# Patient Record
Sex: Female | Born: 1975 | Race: Black or African American | Hispanic: No | Smoking: Never smoker
Health system: Southern US, Community
[De-identification: ages and names within clinical notes are randomized; demographics above are authoritative.]

## PROBLEM LIST (undated history)

## (undated) DIAGNOSIS — B019 Varicella without complication: Secondary | ICD-10-CM

## (undated) DIAGNOSIS — I10 Essential (primary) hypertension: Secondary | ICD-10-CM

## (undated) DIAGNOSIS — G43909 Migraine, unspecified, not intractable, without status migrainosus: Secondary | ICD-10-CM

## (undated) HISTORY — DX: Varicella without complication: B01.9

## (undated) HISTORY — DX: Migraine, unspecified, not intractable, without status migrainosus: G43.909

---

## 1999-05-30 ENCOUNTER — Other Ambulatory Visit: Admission: RE | Admit: 1999-05-30 | Discharge: 1999-05-30 | Payer: Self-pay | Admitting: Obstetrics and Gynecology

## 2002-09-17 ENCOUNTER — Other Ambulatory Visit: Admission: RE | Admit: 2002-09-17 | Discharge: 2002-09-17 | Payer: Self-pay | Admitting: Obstetrics and Gynecology

## 2002-09-22 ENCOUNTER — Ambulatory Visit (HOSPITAL_COMMUNITY): Admission: RE | Admit: 2002-09-22 | Discharge: 2002-09-22 | Payer: Self-pay | Admitting: Obstetrics and Gynecology

## 2002-09-22 ENCOUNTER — Encounter: Payer: Self-pay | Admitting: Obstetrics and Gynecology

## 2003-02-18 ENCOUNTER — Inpatient Hospital Stay (HOSPITAL_COMMUNITY): Admission: AD | Admit: 2003-02-18 | Discharge: 2003-02-20 | Payer: Self-pay | Admitting: Obstetrics and Gynecology

## 2008-12-13 ENCOUNTER — Emergency Department (HOSPITAL_BASED_OUTPATIENT_CLINIC_OR_DEPARTMENT_OTHER): Admission: EM | Admit: 2008-12-13 | Discharge: 2008-12-13 | Payer: Self-pay | Admitting: Emergency Medicine

## 2008-12-13 ENCOUNTER — Ambulatory Visit: Payer: Self-pay | Admitting: Diagnostic Radiology

## 2009-01-10 ENCOUNTER — Emergency Department (HOSPITAL_BASED_OUTPATIENT_CLINIC_OR_DEPARTMENT_OTHER): Admission: EM | Admit: 2009-01-10 | Discharge: 2009-01-11 | Payer: Self-pay | Admitting: Emergency Medicine

## 2009-01-10 ENCOUNTER — Ambulatory Visit: Payer: Self-pay | Admitting: Diagnostic Radiology

## 2010-09-14 NOTE — Discharge Summary (Signed)
NAME:  Tina Swanson, Tina Swanson                    ACCOUNT NO.:  [id number]   MEDICAL RECORD NO.:  [id number]                   PATIENT TYPE:  INP   LOCATION:  9121                                 FACILITY:  [facility]   PHYSICIAN:  Huel Cote, M.D.              DATE OF BIRTH:  1975/10/01   DATE OF ADMISSION:  02/18/2003  DATE OF DISCHARGE:  02/20/2003                                 DISCHARGE SUMMARY   DISCHARGE DIAGNOSES:  1. Term pregnancy at [redacted] weeks gestation.  2. Status post normal spontaneous vaginal delivery.   DISCHARGE MEDICATIONS:  1. Motrin 600 mg p.o. every 6 hours.  2. Percocet one to two tablets p.o. every 4 hours p.r.n.   HOSPITAL FOLLOW UP:  The patient is to follow up in the office in six weeks  for her routine postpartum exam.   HOSPITAL COURSE:  The patient is a 35 year old G 5, P 1-0-3-1 who is  admitted on February 18, 2003 in active labor.  Prenatal care was essentially  uncomplicated and on admission the patient was noted to be contracting  regularly and 5 cm dilated.  She did have some size less than dates issue at  term.  However, an ultrasound revealed normal growth in the 29th percentile  at 33 weeks.   PRENATAL LABORATORY DATA:  O positive, antibody negative, sickle negative,  RPR nonreactive, rubella immune, hepatitis B surface antigen negative, HIV  negative, GC negative, Chlamydia negative, triple screen normal, group B  strep positive.  One-hour Glucola 111.   PAST MEDICAL HISTORY:  None.   PAST GYNECOLOGICAL HISTORY:  History of HSV.   PAST OBSTETRICAL HISTORY:  In 1995 she had a spontaneous miscarriage.  In  1998 she had a 5 pound 15 ounce infant vaginally.  In 2002 she had elective  abortions.   PAST SURGICAL HISTORY:  None.   On admission, the patient was afebrile with stable vital signs.  Heart exam  is regular.  Lungs are clear.  Blood pressure is 138/82.  She was placed on  penicillin for her positive group B strep status and her  progress was  observed.  She did have rupture of membranes performed at 6 cm and was  placed on Pitocin for augmentation of labor.  This was stopped when she  began to have recurrent variable decelerations and with repositioning the  variables resolved.  The patient reached complete dilation quickly, pushed  well and had a normal spontaneous vaginal delivery of a viable female infant.  Apgars are 9 and 9.  Weight was 6 pounds 13 ounces.  The baby was delivered  through a loose nuchal cord x1.  Placenta was spontaneously delivered  intact.  Perineum had a small  abrasion but did not require suturing. On postpartum day #2 she was doing  very well.  She had no complaints, she was afebrile with stable vital signs.  Her fundus was firm and therefore she was discharged  to home with Motrin and  Percocet and follow up as previously stated.                                               Huel Cote, M.D.    KR/MEDQ  D:  02/20/2003  T:  02/20/2003  Job:  [id number]

## 2013-04-16 ENCOUNTER — Encounter (HOSPITAL_BASED_OUTPATIENT_CLINIC_OR_DEPARTMENT_OTHER): Payer: Self-pay | Admitting: Emergency Medicine

## 2013-04-16 ENCOUNTER — Emergency Department (HOSPITAL_BASED_OUTPATIENT_CLINIC_OR_DEPARTMENT_OTHER)
Admission: EM | Admit: 2013-04-16 | Discharge: 2013-04-17 | Disposition: A | Discharge status: Home / Self Care | Payer: Managed Care, Other (non HMO) | Attending: Emergency Medicine | Admitting: Emergency Medicine

## 2013-04-16 DIAGNOSIS — S139XXA Sprain of joints and ligaments of unspecified parts of neck, initial encounter: Secondary | ICD-10-CM | POA: Insufficient documentation

## 2013-04-16 DIAGNOSIS — S60229A Contusion of unspecified hand, initial encounter: Secondary | ICD-10-CM | POA: Insufficient documentation

## 2013-04-16 DIAGNOSIS — Y92410 Unspecified street and highway as the place of occurrence of the external cause: Secondary | ICD-10-CM | POA: Insufficient documentation

## 2013-04-16 DIAGNOSIS — S161XXA Strain of muscle, fascia and tendon at neck level, initial encounter: Secondary | ICD-10-CM

## 2013-04-16 DIAGNOSIS — Y9389 Activity, other specified: Secondary | ICD-10-CM | POA: Insufficient documentation

## 2013-04-16 DIAGNOSIS — IMO0002 Reserved for concepts with insufficient information to code with codable children: Secondary | ICD-10-CM | POA: Insufficient documentation

## 2013-04-16 DIAGNOSIS — T148XXA Other injury of unspecified body region, initial encounter: Secondary | ICD-10-CM
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
## 2013-04-17 ENCOUNTER — Emergency Department (HOSPITAL_BASED_OUTPATIENT_CLINIC_OR_DEPARTMENT_OTHER): Payer: Managed Care, Other (non HMO)

## 2013-04-17 MED ORDER — IBUPROFEN 600 MG PO TABS: 600.0000 mg | ORAL_TABLET | Freq: Four times a day (QID) | ORAL | Status: DC | PRN

## 2013-04-17 MED ORDER — METHOCARBAMOL 500 MG PO TABS: 500.0000 mg | ORAL_TABLET | Freq: Two times a day (BID) | ORAL | Status: DC

## 2013-04-17 MED ORDER — IBUPROFEN 400 MG PO TABS: 400.0000 mg | ORAL_TABLET | Freq: Once | ORAL | Status: DC

## 2013-04-17 MED ORDER — HYDROCODONE-ACETAMINOPHEN 5-325 MG PO TABS: 1.0000 | ORAL_TABLET | Freq: Four times a day (QID) | ORAL | Status: DC | PRN

## 2013-04-17 MED ORDER — HYDROCODONE-ACETAMINOPHEN 5-325 MG PO TABS
1.0000 | ORAL_TABLET | Freq: Once | ORAL | Status: AC
  Administered 2013-04-17: 1 via ORAL
  Filled 2013-04-17: qty 1

## 2016-05-30 ENCOUNTER — Ambulatory Visit (INDEPENDENT_AMBULATORY_CARE_PROVIDER_SITE_OTHER): Payer: Managed Care, Other (non HMO) | Admitting: Nurse Practitioner

## 2016-05-30 ENCOUNTER — Encounter: Payer: Self-pay | Admitting: Nurse Practitioner

## 2016-05-30 ENCOUNTER — Other Ambulatory Visit (INDEPENDENT_AMBULATORY_CARE_PROVIDER_SITE_OTHER): Payer: Managed Care, Other (non HMO)

## 2016-05-30 VITALS — BP 138/94 | HR 77 | Temp 98.0°F | Ht 65.0 in | Wt 173.0 lb

## 2016-05-30 DIAGNOSIS — M545 Low back pain, unspecified: Secondary | ICD-10-CM

## 2016-05-30 DIAGNOSIS — Z0001 Encounter for general adult medical examination with abnormal findings: Secondary | ICD-10-CM | POA: Diagnosis not present

## 2016-05-30 DIAGNOSIS — Z23 Encounter for immunization: Secondary | ICD-10-CM

## 2016-05-30 LAB — CBC WITH DIFFERENTIAL/PLATELET
BASOS ABS: 0 10*3/uL (ref 0.0–0.1)
Basophils Relative: 0.7 % (ref 0.0–3.0)
Eosinophils Absolute: 0 10*3/uL (ref 0.0–0.7)
Eosinophils Relative: 0.6 % (ref 0.0–5.0)
HEMATOCRIT: 36.8 % (ref 36.0–46.0)
Hemoglobin: 12.2 g/dL (ref 12.0–15.0)
LYMPHS ABS: 1.7 10*3/uL (ref 0.7–4.0)
LYMPHS PCT: 37.4 % (ref 12.0–46.0)
MCHC: 33.2 g/dL (ref 30.0–36.0)
MCV: 83.6 fl (ref 78.0–100.0)
MONOS PCT: 5.6 % (ref 3.0–12.0)
Monocytes Absolute: 0.3 10*3/uL (ref 0.1–1.0)
NEUTROS PCT: 55.7 % (ref 43.0–77.0)
Neutro Abs: 2.5 10*3/uL (ref 1.4–7.7)
PLATELETS: 295 10*3/uL (ref 150.0–400.0)
RBC: 4.4 Mil/uL (ref 3.87–5.11)
RDW: 12.8 % (ref 11.5–15.5)
WBC: 4.4 10*3/uL (ref 4.0–10.5)

## 2016-05-30 LAB — COMPREHENSIVE METABOLIC PANEL
ALT: 12 U/L (ref 0–35)
AST: 14 U/L (ref 0–37)
Albumin: 4.1 g/dL (ref 3.5–5.2)
Alkaline Phosphatase: 57 U/L (ref 39–117)
BILIRUBIN TOTAL: 0.3 mg/dL (ref 0.2–1.2)
BUN: 10 mg/dL (ref 6–23)
CO2: 29 meq/L (ref 19–32)
CREATININE: 0.77 mg/dL (ref 0.40–1.20)
Calcium: 9 mg/dL (ref 8.4–10.5)
Chloride: 107 mEq/L (ref 96–112)
GFR: 106.03 mL/min (ref >60.00)
GLUCOSE: 84 mg/dL (ref 70–99)
Potassium: 3.8 mEq/L (ref 3.5–5.1)
Sodium: 139 mEq/L (ref 135–145)
Total Protein: 7.4 g/dL (ref 6.0–8.3)

## 2016-05-30 LAB — LIPID PANEL
CHOL/HDL RATIO: 4
Cholesterol: 203 mg/dL — ABNORMAL HIGH (ref 0–200)
HDL: 49.8 mg/dL (ref >39.00)
LDL CALC: 139 mg/dL — AB (ref 0–99)
NonHDL: 152.7
Triglycerides: 69 mg/dL (ref 0.0–149.0)
VLDL: 13.8 mg/dL (ref 0.0–40.0)

## 2016-05-30 LAB — URINALYSIS, ROUTINE W REFLEX MICROSCOPIC
Bilirubin Urine: NEGATIVE
KETONES UR: NEGATIVE
Leukocytes, UA: NEGATIVE
NITRITE: NEGATIVE
Specific Gravity, Urine: 1.025 (ref 1.000–1.030)
Total Protein, Urine: NEGATIVE
UROBILINOGEN UA: 0.2 (ref 0.0–1.0)
Urine Glucose: NEGATIVE
pH: 5.5 (ref 5.0–8.0)

## 2016-05-30 LAB — TSH: TSH: 0.81 u[IU]/mL (ref 0.35–4.50)

## 2016-05-30 MED ORDER — DICLOFENAC SODIUM 2 % TD SOLN: 1.0000 "application " | Freq: Two times a day (BID) | TRANSDERMAL | 0 refills | Status: DC | PRN

## 2016-05-30 NOTE — Progress Notes (Signed)
Pre visit review using our clinic review tool, if applicable. No additional management support is needed unless otherwise documented below in the visit note. 

## 2016-05-30 NOTE — Progress Notes (Signed)
Subjective:    Patient ID: Tina Swanson, female    DOB: 03/05/1976, 41 y.o.   MRN: [id number]  Patient presents today for complete physical or establish care (new patient) Back Pain  This is a recurrent problem. The current episode started more than 1 month ago. The problem occurs intermittently. The problem has been waxing and waning since onset. The pain is present in the lumbar spine. The quality of the pain is described as aching and cramping. The pain does not radiate. The symptoms are aggravated by sitting and position. Pertinent negatives include no abdominal pain, bladder incontinence, bowel incontinence, chest pain, dysuria, fever, headaches, leg pain, numbness, paresis, paresthesias, pelvic pain, perianal numbness, tingling, weakness or weight loss. Risk factors include sedentary lifestyle, obesity, lack of exercise and poor posture. She has tried NSAIDs and heat for the symptoms. The treatment provided mild relief.    Immunizations: (TDAP, Hep C screen, Pneumovax, Influenza, zoster)  Health Maintenance  Topic Date Due  . Pap Smear  01/06/1996  . HIV Screening  05/30/2017*  . Tetanus Vaccine  05/30/2026  . Flu Shot  Completed  *Topic was postponed. The date shown is not the original due date.   Diet:regular Weight:  Wt Readings from Last 3 Encounters:  05/30/16 173 lb (78.5 kg)  04/16/13 160 lb (72.6 kg)   Exercise:walking Fall Risk: Fall Risk  05/30/2016  Falls in the past year? No   Home Safety:home with husband and children.  Depression/Suicide: Depression screen PHQ 2/9 05/30/2016  Decreased Interest 2  Down, Depressed, Hopeless 2  PHQ - 2 Score 4  Altered sleeping 1  Tired, decreased energy 1  Change in appetite 0  Feeling bad or failure about yourself  0  Trouble concentrating 1  Moving slowly or fidgety/restless 0  Suicidal thoughts 0  PHQ-9 Score 7   No flowsheet data found. Pap Smear (every 46yr for >21-29 without HPV, every 578yrfor >30-6563yrwith HPV):up to date, hx of abnormal, needs repeat annually Mammogram (yearly, >45y7yreeded Vision:needed Dental:needed Sexual History (birth control, marital status, STD):married, 4children  Medications and allergies reviewed with patient and updated if appropriate.  There are no active problems to display for this patient.   Current Outpatient Prescriptions on File Prior to Visit  Medication Sig Dispense Refill  . ibuprofen (ADVIL,MOTRIN) 600 MG tablet Take 1 tablet (600 mg total) by mouth every 6 (six) hours as needed. 30 tablet 0  . ibuprofen (ADVIL,MOTRIN) 800 MG tablet Take 800 mg by mouth every 8 (eight) hours as needed.    . Multiple Vitamins-Minerals (MULTIVITAMIN WITH MINERALS) tablet Take 1 tablet by mouth daily.     No current facility-administered medications on file prior to visit.     Past Medical History:  Diagnosis Date  . Chicken pox   . Migraines     History reviewed. No pertinent surgical history.  Social History   Social History  . Marital status: Married    Spouse name: N/A  . Number of children: N/A  . Years of education: N/A   Social History Main Topics  . Smoking status: Never Smoker  . Smokeless tobacco: Never Used  . Alcohol use Yes     Comment: social use  . Drug use: No  . Sexual activity: Yes    Birth control/ protection: IUD   Other Topics Concern  . None   Social History Narrative  . None    Family History  Problem Relation Age of Onset  .  Heart disease Mother 35    MI and CAD and death  . Hypertension Mother   . Alcohol abuse Father   . Cancer Father 6    leukemia  . Cancer Maternal Grandmother 50    ovarian  . Diabetes Brother         Review of Systems  Constitutional: Negative for fever, malaise/fatigue and weight loss.  HENT: Negative for congestion and sore throat.   Eyes:       Negative for visual changes  Respiratory: Negative for cough and shortness of breath.   Cardiovascular: Negative for chest pain,  palpitations and leg swelling.  Gastrointestinal: Negative for abdominal pain, blood in stool, bowel incontinence, constipation, diarrhea and heartburn.  Genitourinary: Negative for bladder incontinence, dysuria, frequency, pelvic pain and urgency.  Musculoskeletal: Positive for back pain. Negative for falls, joint pain and myalgias.  Skin: Negative for rash.  Neurological: Negative for dizziness, tingling, sensory change, weakness, numbness, headaches and paresthesias.  Endo/Heme/Allergies: Does not bruise/bleed easily.  Psychiatric/Behavioral: Negative for depression, substance abuse and suicidal ideas. The patient is not nervous/anxious.     Objective:   Vitals:   05/30/16 1443  BP: (!) 138/94  Pulse: 77  Temp: 98 F (36.7 C)    Body mass index is 28.79 kg/m.   Physical Examination:  Physical Exam  Constitutional: She is oriented to person, place, and time and well-developed, well-nourished, and in no distress. No distress.  HENT:  Right Ear: External ear normal.  Left Ear: External ear normal.  Nose: Nose normal.  Mouth/Throat: Oropharynx is clear and moist. No oropharyngeal exudate.  Eyes: Conjunctivae and EOM are normal. Pupils are equal, round, and reactive to light. No scleral icterus.  Neck: Normal range of motion. Neck supple. No thyromegaly present.  Cardiovascular: Normal rate, normal heart sounds and intact distal pulses.   Pulmonary/Chest: Effort normal and breath sounds normal. She exhibits no tenderness.  Deferred by patient to GYN  Abdominal: Soft. Bowel sounds are normal. She exhibits no distension. There is no tenderness.  Genitourinary:  Genitourinary Comments: Deferred by patient to GYN  Musculoskeletal: Normal range of motion. She exhibits no edema or tenderness.  Lymphadenopathy:    She has no cervical adenopathy.  Neurological: She is alert and oriented to person, place, and time. She has normal reflexes. No cranial nerve deficit. Gait normal.  Coordination normal.  Skin: Skin is warm and dry.  Psychiatric: Affect and judgment normal.    ASSESSMENT and PLAN:  Tina Swanson was seen today for establish care.  Diagnoses and all orders for this visit:  Encounter for preventative adult health care exam with abnormal findings -     CBC w/Diff; Future -     Comp Met (CMET); Future -     TSH; Future -     Lipid panel; Future  Acute bilateral low back pain without sciatica -     Urinalysis, Routine w reflex microscopic; Future -     Diclofenac Sodium 2 % SOLN; Place 1 application onto the skin 2 (two) times daily as needed.  Need for diphtheria-tetanus-pertussis (Tdap) vaccine, adult/adolescent -     Tdap vaccine greater than or equal to 7yo IM  Encounter for immunization -     Flu Vaccine QUAD 36+ mos IM   No problem-specific Assessment & Plan notes found for this encounter.    Recent Results (from the past 2160 hour(s))  Urinalysis, Routine w reflex microscopic     Status: Abnormal   Collection Time: 05/30/16  3:42 PM  Result Value Ref Range   Color, Urine YELLOW Yellow;Lt. Yellow   APPearance CLEAR Clear   Specific Gravity, Urine 1.025 1.000 - 1.030   pH 5.5 5.0 - 8.0   Total Protein, Urine NEGATIVE Negative   Urine Glucose NEGATIVE Negative   Ketones, ur NEGATIVE Negative   Bilirubin Urine NEGATIVE Negative   Hgb urine dipstick TRACE-INTACT (A) Negative   Urobilinogen, UA 0.2 0.0 - 1.0   Leukocytes, UA NEGATIVE Negative   Nitrite NEGATIVE Negative   WBC, UA 0-2/hpf 0-2/hpf   RBC / HPF 0-2/hpf 0-2/hpf   Squamous Epithelial / LPF Rare(0-4/hpf) Rare(0-4/hpf)  CBC w/Diff     Status: None   Collection Time: 05/30/16  3:42 PM  Result Value Ref Range   WBC 4.4 4.0 - 10.5 K/uL   RBC 4.40 3.87 - 5.11 Mil/uL   Hemoglobin 12.2 12.0 - 15.0 g/dL   HCT 36.8 36.0 - 46.0 %   MCV 83.6 78.0 - 100.0 fl   MCHC 33.2 30.0 - 36.0 g/dL   RDW 12.8 11.5 - 15.5 %   Platelets 295.0 150.0 - 400.0 K/uL   Neutrophils Relative % 55.7  43.0 - 77.0 %   Lymphocytes Relative 37.4 12.0 - 46.0 %   Monocytes Relative 5.6 3.0 - 12.0 %   Eosinophils Relative 0.6 0.0 - 5.0 %   Basophils Relative 0.7 0.0 - 3.0 %   Neutro Abs 2.5 1.4 - 7.7 K/uL   Lymphs Abs 1.7 0.7 - 4.0 K/uL   Monocytes Absolute 0.3 0.1 - 1.0 K/uL   Eosinophils Absolute 0.0 0.0 - 0.7 K/uL   Basophils Absolute 0.0 0.0 - 0.1 K/uL  Comp Met (CMET)     Status: None   Collection Time: 05/30/16  3:42 PM  Result Value Ref Range   Sodium 139 135 - 145 mEq/L   Potassium 3.8 3.5 - 5.1 mEq/L   Chloride 107 96 - 112 mEq/L   CO2 29 19 - 32 mEq/L   Glucose, Bld 84 70 - 99 mg/dL   BUN 10 6 - 23 mg/dL   Creatinine, Ser 0.77 0.40 - 1.20 mg/dL   Total Bilirubin 0.3 0.2 - 1.2 mg/dL   Alkaline Phosphatase 57 39 - 117 U/L   AST 14 0 - 37 U/L   ALT 12 0 - 35 U/L   Total Protein 7.4 6.0 - 8.3 g/dL   Albumin 4.1 3.5 - 5.2 g/dL   Calcium 9.0 8.4 - 10.5 mg/dL   GFR 106.03 >60.00 mL/min  TSH     Status: None   Collection Time: 05/30/16  3:42 PM  Result Value Ref Range   TSH 0.81 0.35 - 4.50 uIU/mL  Lipid panel     Status: Abnormal   Collection Time: 05/30/16  3:42 PM  Result Value Ref Range   Cholesterol 203 (H) 0 - 200 mg/dL    Comment: ATP III Classification       Desirable:  < 200 mg/dL               Borderline High:  200 - 239 mg/dL          High:  > = 240 mg/dL   Triglycerides 69.0 0.0 - 149.0 mg/dL    Comment: Normal:  <150 mg/dLBorderline High:  150 - 199 mg/dL   HDL 49.80 >39.00 mg/dL   VLDL 13.8 0.0 - 40.0 mg/dL   LDL Cholesterol 139 (H) 0 - 99 mg/dL   Total CHOL/HDL Ratio 4  Comment:                Men          Women1/2 Average Risk     3.4          3.3Average Risk          5.0          4.42X Average Risk          9.6          7.13X Average Risk          15.0          11.0                       NonHDL 152.70     Comment: NOTE:  Non-HDL goal should be 30 mg/dL higher than patient's LDL goal (i.e. LDL goal of < 70 mg/dL, would have non-HDL goal of < 100 mg/dL)     Follow up: Return in about 6 months (around 11/27/2016) for repeat lipid panel and depression screen (fasting).  Wilfred Lacy, NP

## 2016-05-30 NOTE — Patient Instructions (Signed)
Back Pain, Adult Introduction Back pain is very common. The pain often gets better over time. The cause of back pain is usually not dangerous. Most people can learn to manage their back pain on their own. Follow these instructions at home: Watch your back pain for any changes. The following actions may help to lessen any pain you are feeling:  Stay active. Start with short walks on flat ground if you can. Try to walk farther each day.  Exercise regularly as told by your doctor. Exercise helps your back heal faster. It also helps avoid future injury by keeping your muscles strong and flexible.  Do not sit, drive, or stand in one place for more than 30 minutes.  Do not stay in bed. Resting more than 1-2 days can slow down your recovery.  Be careful when you bend or lift an object. Use good form when lifting:  Bend at your knees.  Keep the object close to your body.  Do not twist.  Sleep on a firm mattress. Lie on your side, and bend your knees. If you lie on your back, put a pillow under your knees.  Take medicines only as told by your doctor.  Put ice on the injured area.  Put ice in a plastic bag.  Place a towel between your skin and the bag.  Leave the ice on for 20 minutes, 2-3 times a day for the first 2-3 days. After that, you can switch between ice and heat packs.  Avoid feeling anxious or stressed. Find good ways to deal with stress, such as exercise.  Maintain a healthy weight. Extra weight puts stress on your back. Contact a doctor if:  You have pain that does not go away with rest or medicine.  You have worsening pain that goes down into your legs or buttocks.  You have pain that does not get better in one week.  You have pain at night.  You lose weight.  You have a fever or chills. Get help right away if:  You cannot control when you poop (bowel movement) or pee (urinate).  Your arms or legs feel weak.  Your arms or legs lose feeling  (numbness).  You feel sick to your stomach (nauseous) or throw up (vomit).  You have belly (abdominal) pain.  You feel like you may pass out (faint). This information is not intended to replace advice given to you by your health care provider. Make sure you discuss any questions you have with your health care provider. Document Released: 10/02/2007 Document Revised: 09/21/2015 Document Reviewed: 08/17/2013  2017 Elsevier   Back Exercises Introduction If you have pain in your back, do these exercises 2-3 times each day or as told by your doctor. When the pain goes away, do the exercises once each day, but repeat the steps more times for each exercise (do more repetitions). If you do not have pain in your back, do these exercises once each day or as told by your doctor. Exercises Single Knee to Chest  Do these steps 3-5 times in a row for each leg: 1. Lie on your back on a firm bed or the floor with your legs stretched out. 2. Bring one knee to your chest. 3. Hold your knee to your chest by grabbing your knee or thigh. 4. Pull on your knee until you feel a gentle stretch in your lower back. 5. Keep doing the stretch for 10-30 seconds. 6. Slowly let go of your leg and straighten it.  Pelvic Tilt  Do these steps 5-10 times in a row: 1. Lie on your back on a firm bed or the floor with your legs stretched out. 2. Bend your knees so they point up to the ceiling. Your feet should be flat on the floor. 3. Tighten your lower belly (abdomen) muscles to press your lower back against the floor. This will make your tailbone point up to the ceiling instead of pointing down to your feet or the floor. 4. Stay in this position for 5-10 seconds while you gently tighten your muscles and breathe evenly. Cat-Cow  Do these steps until your lower back bends more easily: 1. Get on your hands and knees on a firm surface. Keep your hands under your shoulders, and keep your knees under your hips. You may put  padding under your knees. 2. Let your head hang down, and make your tailbone point down to the floor so your lower back is round like the back of a cat. 3. Stay in this position for 5 seconds. 4. Slowly lift your head and make your tailbone point up to the ceiling so your back hangs low (sags) like the back of a cow. 5. Stay in this position for 5 seconds. Press-Ups  Do these steps 5-10 times in a row: 1. Lie on your belly (face-down) on the floor. 2. Place your hands near your head, about shoulder-width apart. 3. While you keep your back relaxed and keep your hips on the floor, slowly straighten your arms to raise the top half of your body and lift your shoulders. Do not use your back muscles. To make yourself more comfortable, you may change where you place your hands. 4. Stay in this position for 5 seconds. 5. Slowly return to lying flat on the floor. Bridges  Do these steps 10 times in a row: 1. Lie on your back on a firm surface. 2. Bend your knees so they point up to the ceiling. Your feet should be flat on the floor. 3. Tighten your butt muscles and lift your butt off of the floor until your waist is almost as high as your knees. If you do not feel the muscles working in your butt and the back of your thighs, slide your feet 1-2 inches farther away from your butt. 4. Stay in this position for 3-5 seconds. 5. Slowly lower your butt to the floor, and let your butt muscles relax. If this exercise is too easy, try doing it with your arms crossed over your chest. Belly Crunches  Do these steps 5-10 times in a row: 1. Lie on your back on a firm bed or the floor with your legs stretched out. 2. Bend your knees so they point up to the ceiling. Your feet should be flat on the floor. 3. Cross your arms over your chest. 4. Tip your chin a little bit toward your chest but do not bend your neck. 5. Tighten your belly muscles and slowly raise your chest just enough to lift your shoulder blades a  tiny bit off of the floor. 6. Slowly lower your chest and your head to the floor. Back Lifts  Do these steps 5-10 times in a row: 1. Lie on your belly (face-down) with your arms at your sides, and rest your forehead on the floor. 2. Tighten the muscles in your legs and your butt. 3. Slowly lift your chest off of the floor while you keep your hips on the floor. Keep the back of  your head in line with the curve in your back. Look at the floor while you do this. 4. Stay in this position for 3-5 seconds. 5. Slowly lower your chest and your face to the floor. Contact a doctor if:  Your back pain gets a lot worse when you do an exercise.  Your back pain does not lessen 2 hours after you exercise. If you have any of these problems, stop doing the exercises. Do not do them again unless your doctor says it is okay. Get help right away if:  You have sudden, very bad back pain. If this happens, stop doing the exercises. Do not do them again unless your doctor says it is okay. This information is not intended to replace advice given to you by your health care provider. Make sure you discuss any questions you have with your health care provider. Document Released: 05/18/2010 Document Revised: 09/21/2015 Document Reviewed: 06/09/2014  2017 Elsevier

## 2017-11-07 ENCOUNTER — Other Ambulatory Visit: Payer: Self-pay | Admitting: Obstetrics and Gynecology

## 2017-11-07 DIAGNOSIS — N6310 Unspecified lump in the right breast, unspecified quadrant: Secondary | ICD-10-CM

## 2017-11-07 DIAGNOSIS — N644 Mastodynia: Secondary | ICD-10-CM

## 2017-11-12 ENCOUNTER — Ambulatory Visit
Admission: RE | Admit: 2017-11-12 | Discharge: 2017-11-12 | Disposition: A | Discharge status: Home / Self Care | Payer: Managed Care, Other (non HMO) | Source: Ambulatory Visit | Attending: Obstetrics and Gynecology | Admitting: Obstetrics and Gynecology

## 2017-11-12 ENCOUNTER — Other Ambulatory Visit: Payer: Self-pay | Admitting: Obstetrics and Gynecology

## 2017-11-12 ENCOUNTER — Ambulatory Visit: Admission: RE | Admit: 2017-11-12 | Payer: Managed Care, Other (non HMO) | Source: Ambulatory Visit

## 2017-11-12 ENCOUNTER — Other Ambulatory Visit: Payer: Managed Care, Other (non HMO)

## 2017-11-12 DIAGNOSIS — N6320 Unspecified lump in the left breast, unspecified quadrant: Secondary | ICD-10-CM

## 2017-11-12 DIAGNOSIS — N644 Mastodynia: Secondary | ICD-10-CM

## 2017-11-12 DIAGNOSIS — N6310 Unspecified lump in the right breast, unspecified quadrant: Secondary | ICD-10-CM

## 2018-08-14 ENCOUNTER — Telehealth: Payer: Self-pay | Admitting: Nurse Practitioner

## 2018-08-14 NOTE — Telephone Encounter (Signed)
I called and left message on patient voicemail per Wilfred Lacy to call office and schedule appointment for CPE.

## 2019-02-22 IMAGING — US ULTRASOUND LEFT BREAST LIMITED
1 series · 13 of 13 positions shown · non-contrast
Comparison: Previous exams including most recent bilateral
screening mammogram dated 01/06/2017.

CLINICAL DATA: Ordering physician describes palpable lumps within
the LEFT breast at the 6 o'clock and 9 o'clock axes. Patient cannot
localize the lumps today.

EXAM:
DIGITAL DIAGNOSTIC LEFT MAMMOGRAM WITH CAD AND TOMO
ULTRASOUND LEFT BREAST

[Series 1: ultrasound left breast limited · 0.07mm/px · 13 of 13 slices shown]
[im 1/13]
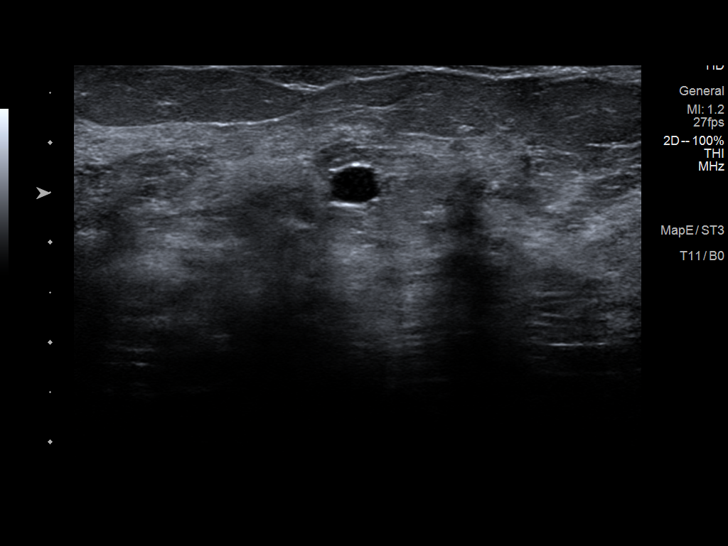
[im 2/13]
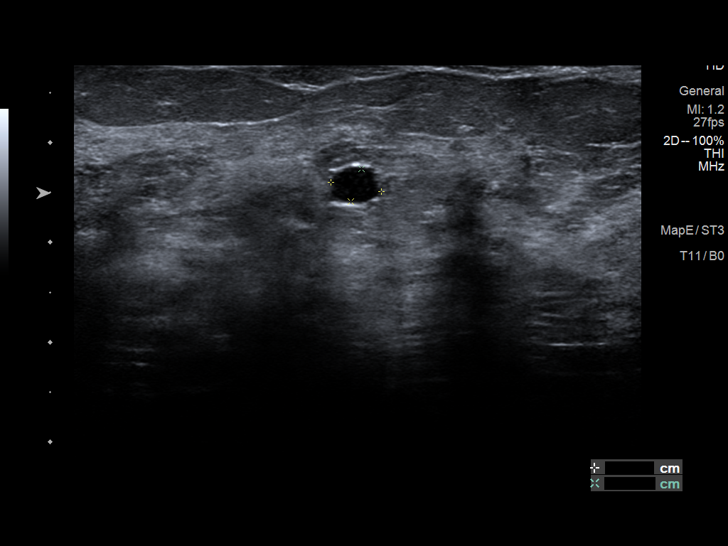
[im 3/13]
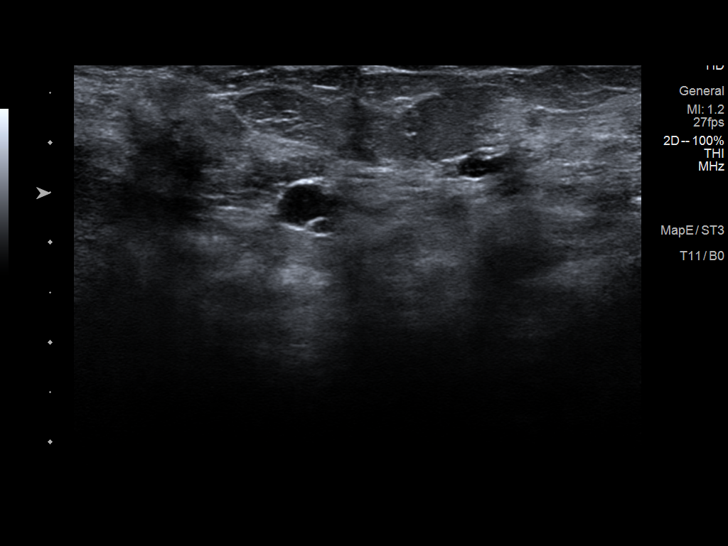
[im 4/13]
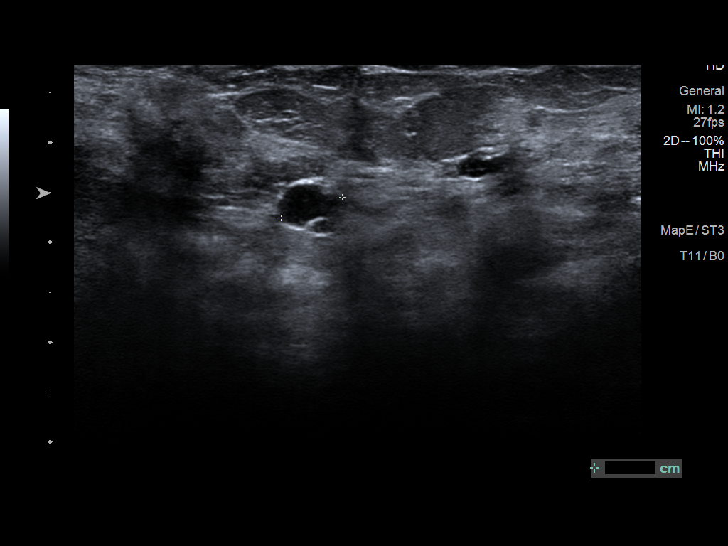
[im 5/13]
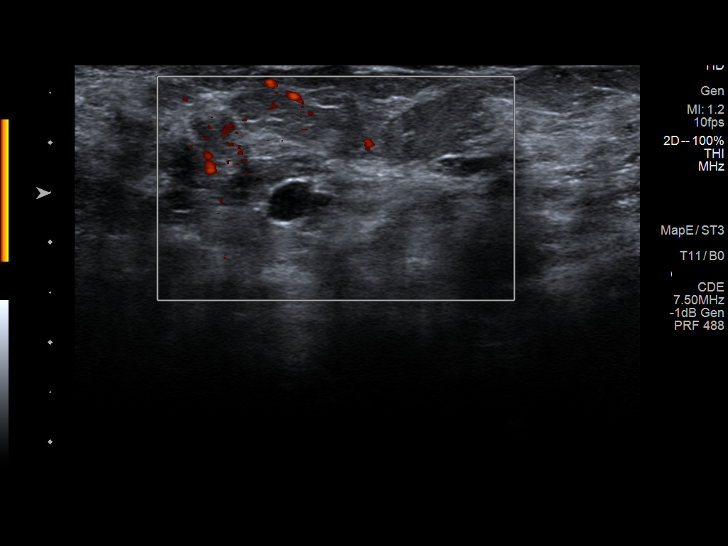
[im 6/13]
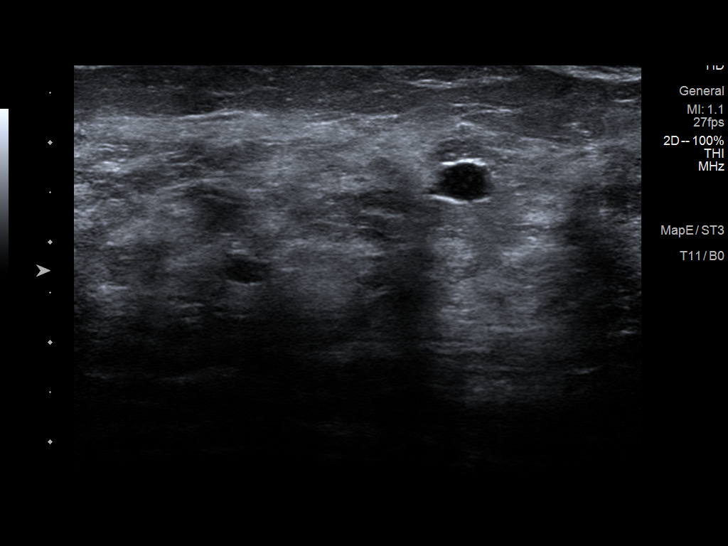
[im 7/13]
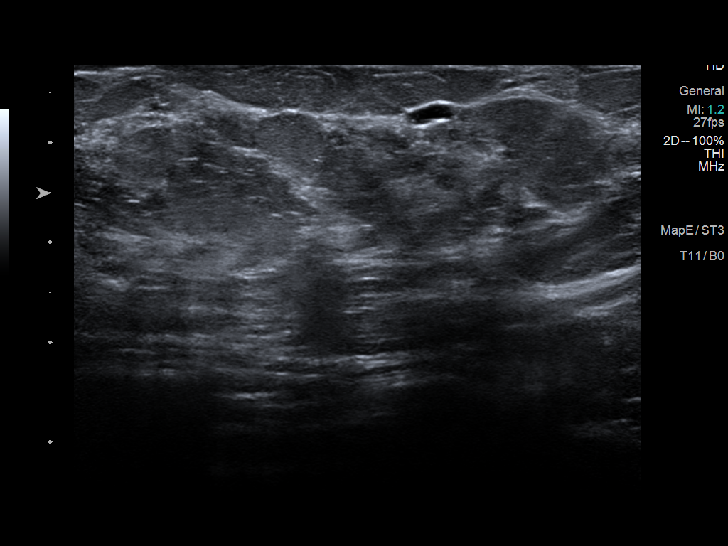
[im 8/13]
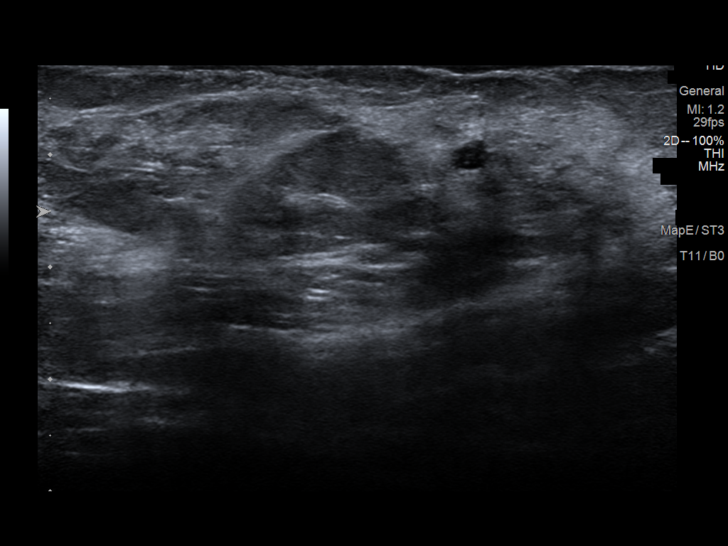
[im 9/13]
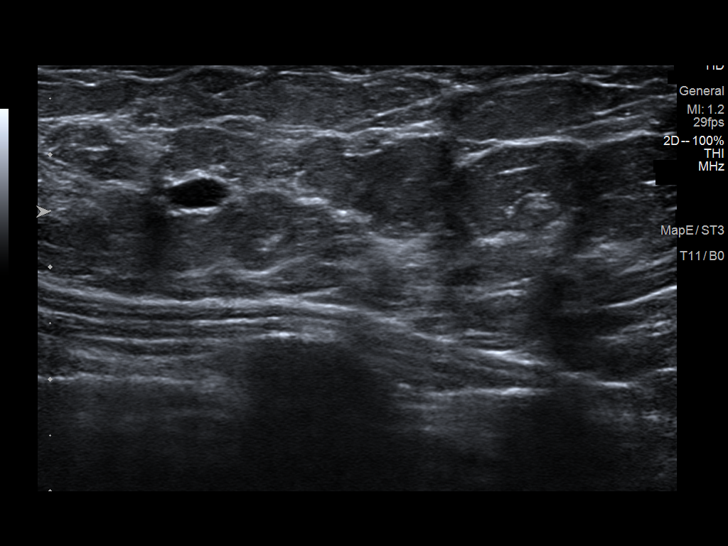
[im 10/13]
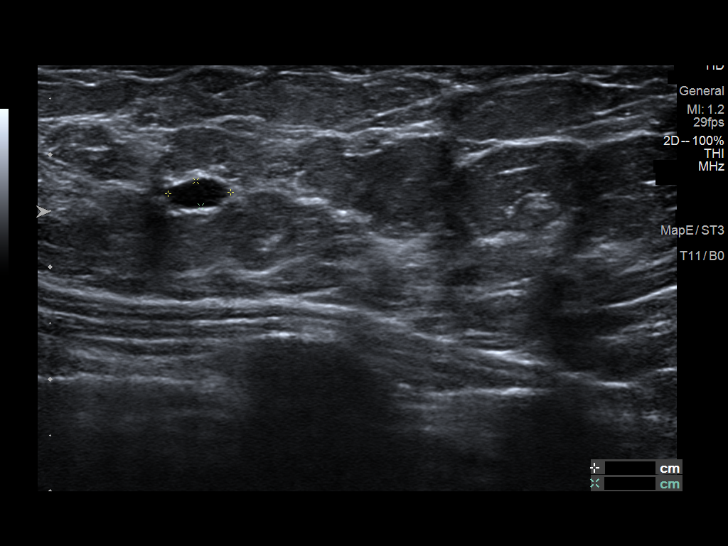
[im 11/13]
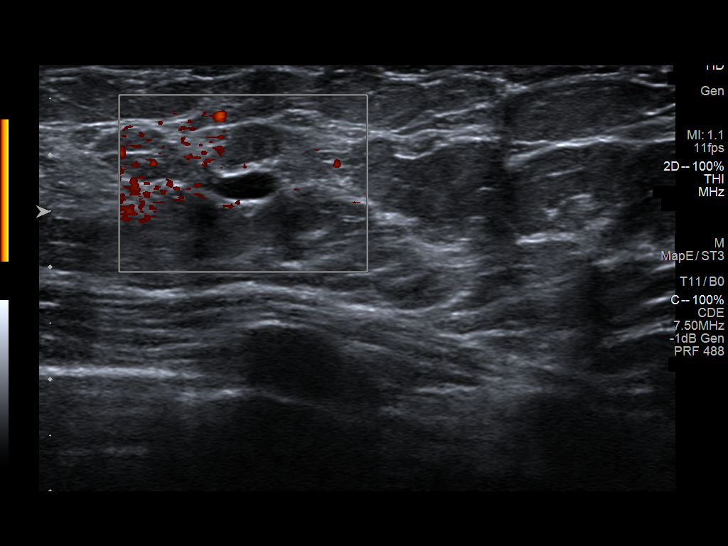
[im 12/13]
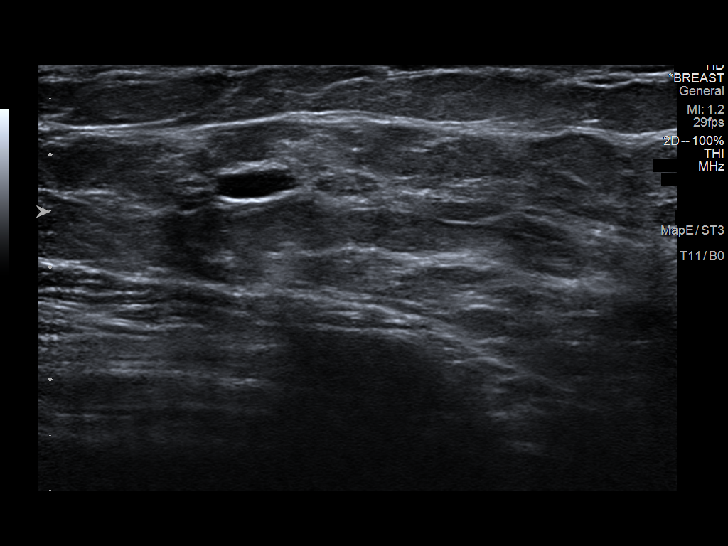
[im 13/13]
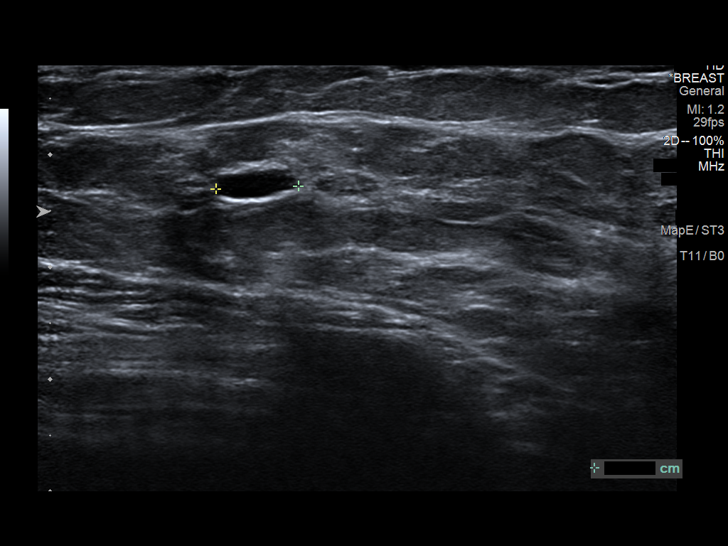

[13 of 13 positions shown; findings below may reference images not displayed]

ACR Breast Density Category c: The breast tissue is heterogeneously
dense, which may obscure small masses.
FINDINGS: There are multiple oval circumscribed masses within the central,
lower and outer LEFT breast. No new suspicious masses, suspicious
calcifications or secondary signs of malignancy are identified
within either breast.

Mammographic images were processed with CAD.

Targeted ultrasound is performed, evaluating the inferior and inner
portions of the LEFT breast, showing scattered small benign cysts,
largest measuring 7 mm, possible sources for the palpable findings.
Alternatively, palpable findings may be related to ridges of normal
dense fibroglandular tissue within the inferior and inner LEFT
breast. No suspicious solid or cystic mass is identified within the
areas of clinical concern.
IMPRESSION: No evidence of malignancy within the LEFT breast. Multiple benign
cysts are present within the LEFT breast at the 6 o'clock and 9
o'clock axes, as well as ridges of normal dense fibroglandular
tissue, corresponding to the areas of clinical concern.

RECOMMENDATION:
1. Annual screening mammograms.
2. The patient was instructed to return sooner if a new palpable
abnormality is identified in either breast.

I have discussed the findings and recommendations with the patient.
Results were also provided in writing at the conclusion of the
visit. If applicable, a reminder letter will be sent to the patient
regarding the next appointment.

BI-RADS CATEGORY  2: Benign.

## 2019-02-22 IMAGING — MG DIGITAL DIAGNOSTIC UNILATERAL LEFT MAMMOGRAM WITH TOMO AND CAD
8 series · 8 of 24 positions shown · non-contrast
Comparison: Previous exams including most recent bilateral
screening mammogram dated 01/06/2017.

CLINICAL DATA: Ordering physician describes palpable lumps within
the LEFT breast at the 6 o'clock and 9 o'clock axes. Patient cannot
localize the lumps today.

EXAM:
DIGITAL DIAGNOSTIC LEFT MAMMOGRAM WITH CAD AND TOMO
ULTRASOUND LEFT BREAST

[L CC synth-2D (1 of 2)]
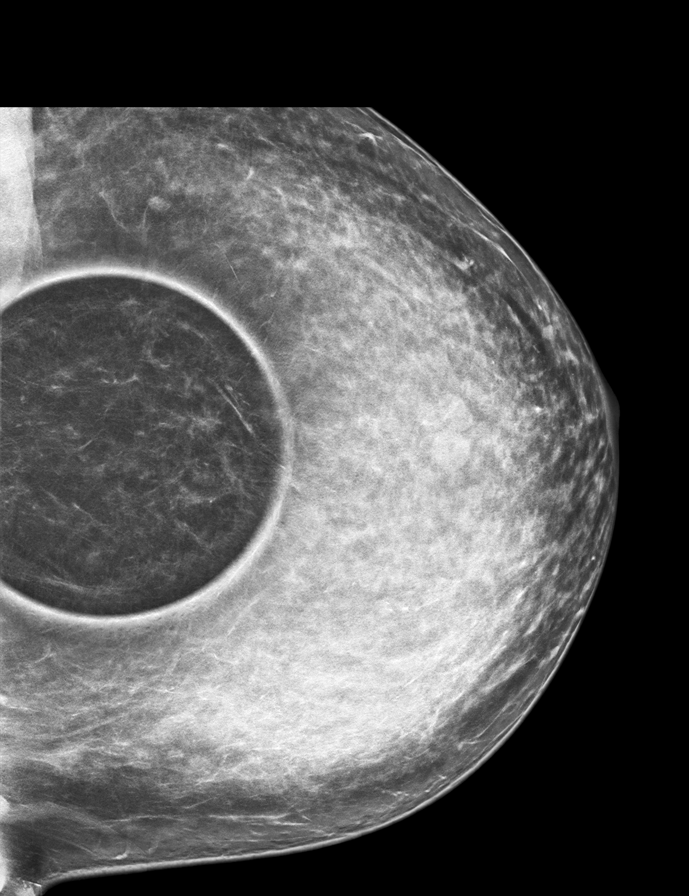

[L ML synth-2D]
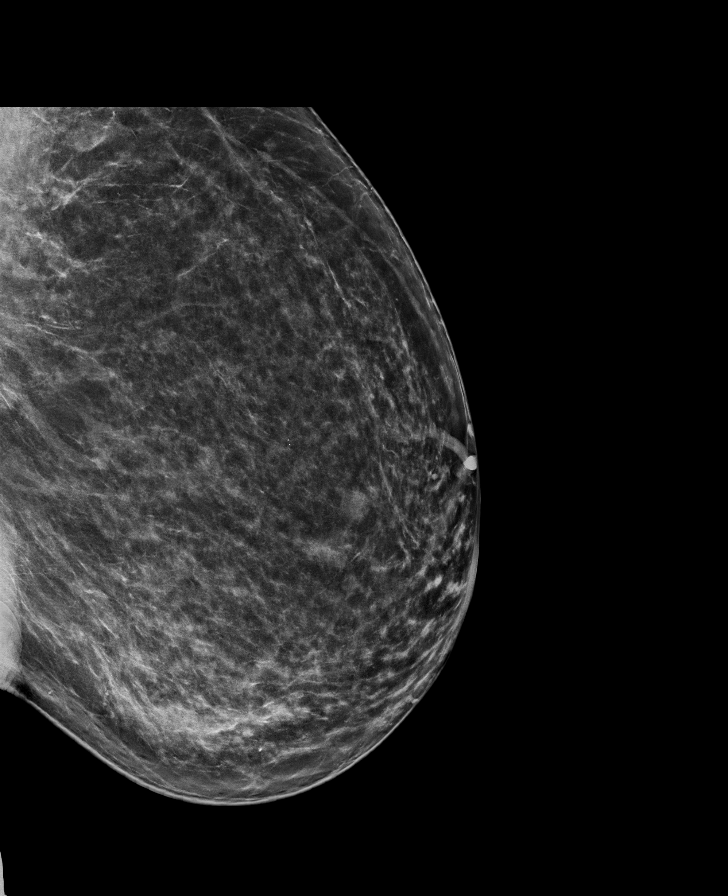

[L CC synth-2D (2 of 2)]
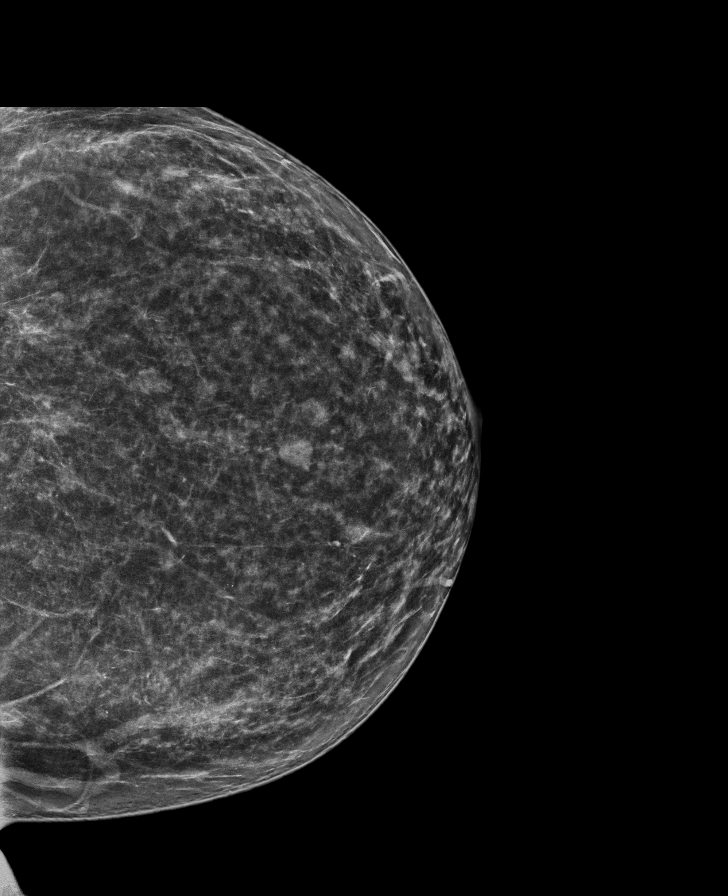

[L MLO synth-2D]
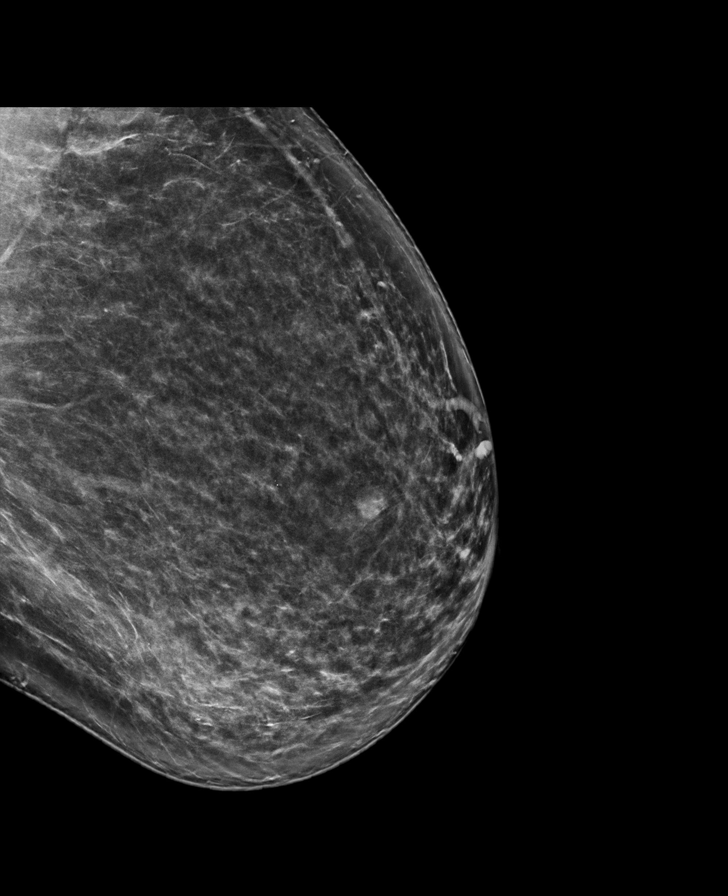

[L CC tomo (1 of 2) · tomo slice 41/80.0]
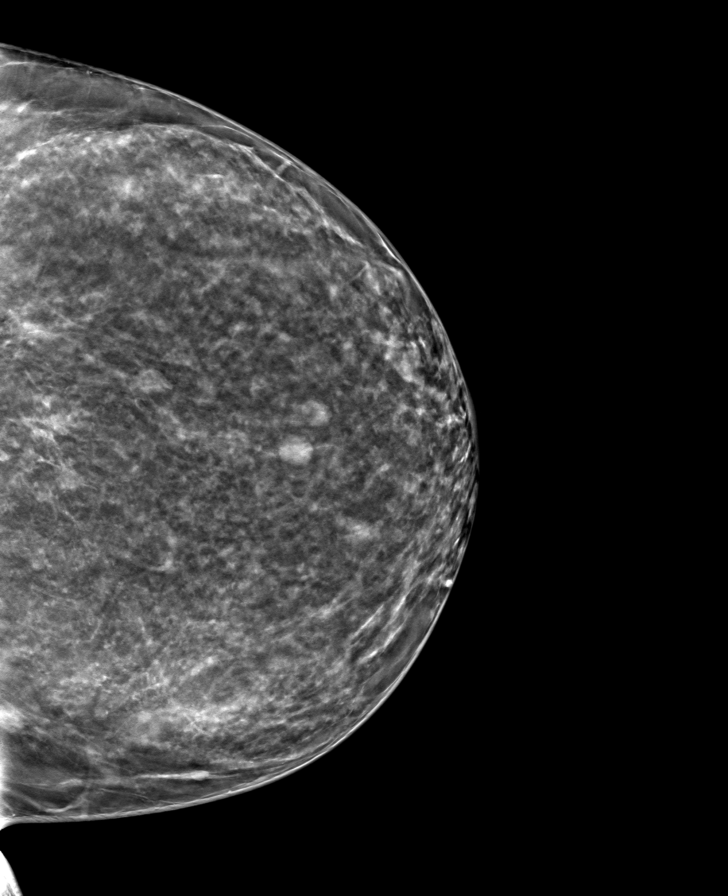

[L CC tomo (2 of 2) · tomo slice 39/77.0]
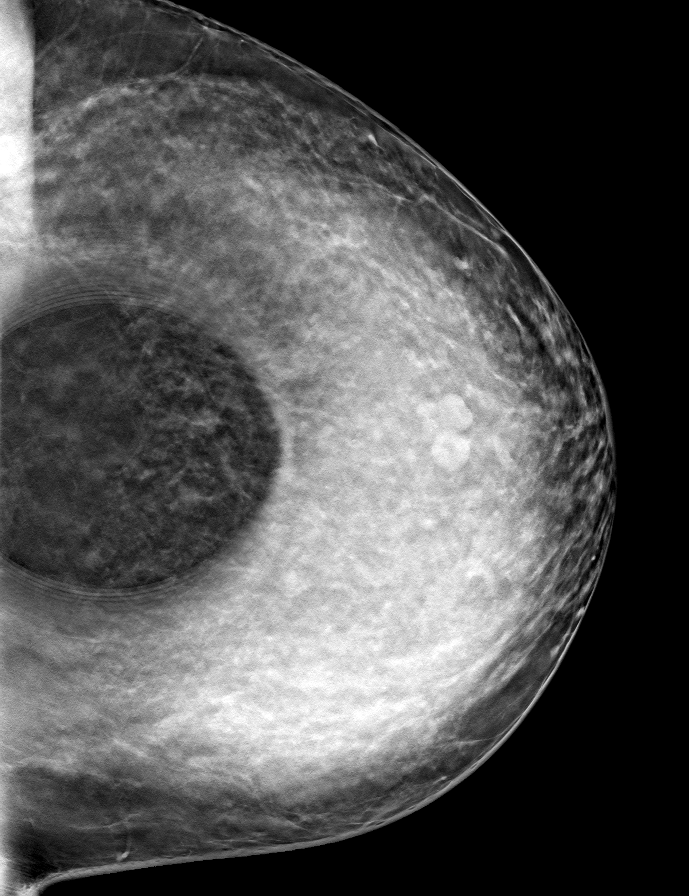

[L MLO tomo · tomo slice 46/91.0]
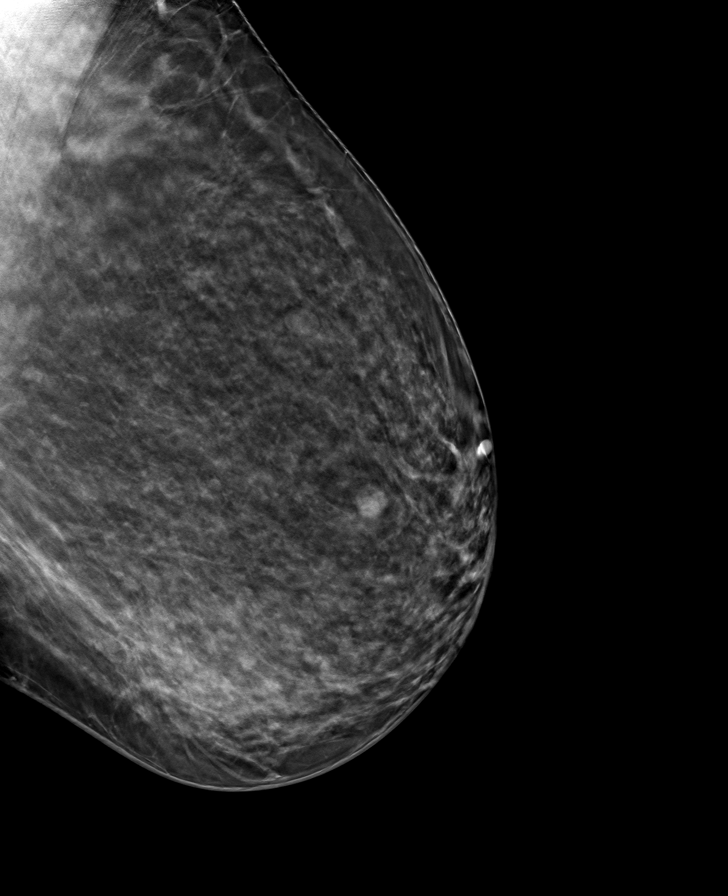

[L ML tomo · tomo slice 43/86.0]
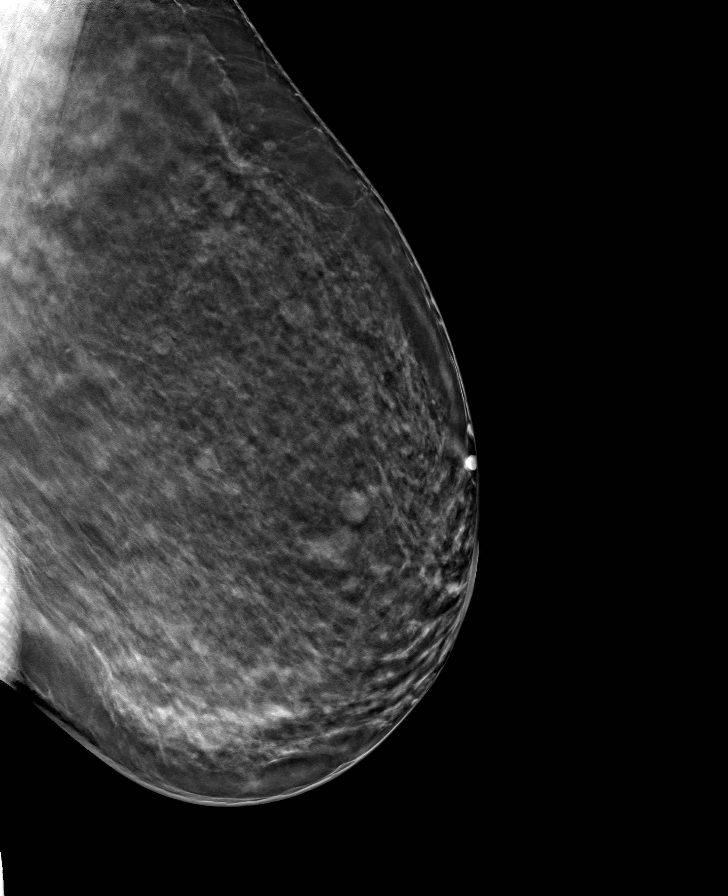

[8 of 24 positions shown; findings below may reference images not displayed]

ACR Breast Density Category c: The breast tissue is heterogeneously
dense, which may obscure small masses.
FINDINGS: There are multiple oval circumscribed masses within the central,
lower and outer LEFT breast. No new suspicious masses, suspicious
calcifications or secondary signs of malignancy are identified
within either breast.

Mammographic images were processed with CAD.

Targeted ultrasound is performed, evaluating the inferior and inner
portions of the LEFT breast, showing scattered small benign cysts,
largest measuring 7 mm, possible sources for the palpable findings.
Alternatively, palpable findings may be related to ridges of normal
dense fibroglandular tissue within the inferior and inner LEFT
breast. No suspicious solid or cystic mass is identified within the
areas of clinical concern.
IMPRESSION: No evidence of malignancy within the LEFT breast. Multiple benign
cysts are present within the LEFT breast at the 6 o'clock and 9
o'clock axes, as well as ridges of normal dense fibroglandular
tissue, corresponding to the areas of clinical concern.

RECOMMENDATION:
1. Annual screening mammograms.
2. The patient was instructed to return sooner if a new palpable
abnormality is identified in either breast.

I have discussed the findings and recommendations with the patient.
Results were also provided in writing at the conclusion of the
visit. If applicable, a reminder letter will be sent to the patient
regarding the next appointment.

BI-RADS CATEGORY  2: Benign.

## 2019-04-28 DIAGNOSIS — D259 Leiomyoma of uterus, unspecified: Secondary | ICD-10-CM | POA: Insufficient documentation

## 2019-08-05 ENCOUNTER — Ambulatory Visit: Payer: Self-pay | Attending: Internal Medicine

## 2019-08-05 DIAGNOSIS — Z23 Encounter for immunization: Secondary | ICD-10-CM

## 2019-08-05 NOTE — Progress Notes (Signed)
   Covid-19 Vaccination Clinic  Name:  Tina Swanson    MRN: [id number] DOB: 1976-02-21  08/05/2019  Tina Swanson was observed post Covid-19 immunization for 15 minutes without incident. She was provided with Vaccine Information Sheet and instruction to access the V-Safe system.   Tina Swanson was instructed to call 911 with any severe reactions post vaccine: Marland Kitchen Difficulty breathing  . Swelling of face and throat  . A fast heartbeat  . A bad rash all over body  . Dizziness and weakness   Immunizations Administered    Name Date Dose VIS Date Route   Pfizer COVID-19 Vaccine 08/05/2019  3:16 PM 0.3 mL 04/09/2019 Intramuscular   Manufacturer: [place]   Lot: [id number]   [place]: [id number]

## 2019-08-30 ENCOUNTER — Ambulatory Visit: Payer: Self-pay | Attending: Internal Medicine

## 2019-08-30 DIAGNOSIS — Z23 Encounter for immunization: Secondary | ICD-10-CM

## 2019-08-30 NOTE — Progress Notes (Signed)
   Covid-19 Vaccination Clinic  Name:  Tina Swanson    MRN: [id number] DOB: 10/30/1975  08/30/2019  [name] was observed post Covid-19 immunization for 15 minutes without incident. She was provided with Vaccine Information Sheet and instruction to access the V-Safe system.   [name] was instructed to call 911 with any severe reactions post vaccine: Marland Kitchen Difficulty breathing  . Swelling of face and throat  . A fast heartbeat  . A bad rash all over body  . Dizziness and weakness   Immunizations Administered    Name Date Dose VIS Date Route   Pfizer COVID-19 Vaccine 08/30/2019  4:07 PM 0.3 mL 06/23/2018 Intramuscular   Manufacturer: [place]   Lot: [id number]   [place]: [id number]

## 2019-08-31 ENCOUNTER — Ambulatory Visit: Payer: Self-pay

## 2019-12-20 ENCOUNTER — Other Ambulatory Visit: Payer: Self-pay

## 2019-12-21 ENCOUNTER — Ambulatory Visit (INDEPENDENT_AMBULATORY_CARE_PROVIDER_SITE_OTHER): Payer: Managed Care, Other (non HMO) | Admitting: Nurse Practitioner

## 2019-12-21 ENCOUNTER — Encounter: Payer: Self-pay | Admitting: Nurse Practitioner

## 2019-12-21 VITALS — BP 122/86 | Temp 97.7°F | Ht 66.75 in | Wt 187.6 lb

## 2019-12-21 DIAGNOSIS — A6000 Herpesviral infection of urogenital system, unspecified: Secondary | ICD-10-CM | POA: Insufficient documentation

## 2019-12-21 DIAGNOSIS — L292 Pruritus vulvae: Secondary | ICD-10-CM | POA: Insufficient documentation

## 2019-12-21 DIAGNOSIS — F3281 Premenstrual dysphoric disorder: Secondary | ICD-10-CM | POA: Diagnosis not present

## 2019-12-21 MED ORDER — FLUOXETINE HCL 10 MG PO CAPS: 10.0000 mg | ORAL_CAPSULE | Freq: Every day | ORAL | 5 refills | Status: DC

## 2019-12-21 NOTE — Assessment & Plan Note (Signed)
Onset 78yrs ago, IUD in place(mirena)-unsure about date of insertion, has intermittent spotting. Mood swings, pelvic and back pain (symptoms are waxing and waning monthly) Some improvement with naproxen.  Start prozac F/up in 68month

## 2019-12-21 NOTE — Progress Notes (Signed)
Subjective:  Patient ID: Tina Swanson, female    DOB: Jul 18, 1975  Age: 44 y.o. MRN: [id number]  CC: Establish Care (mood swings with mentrual cycle)  HPI  Premenstrual dysphoric disorder Onset 39yrs ago, IUD in place(mirena)-unsure about date of insertion, has intermittent spotting. Mood swings, pelvic and back pain (symptoms are waxing and waning monthly) Some improvement with naproxen.  Start prozac F/up in 23month  Depression screen [facility] 2/9 12/21/2019 05/30/2016  Decreased Interest 1 2  Down, Depressed, Hopeless 2 2  PHQ - 2 Score 3 4  Altered sleeping 1 1  Tired, decreased energy 3 1  Change in appetite - 0  Feeling bad or failure about yourself  3 0  Trouble concentrating 2 1  Moving slowly or fidgety/restless 0 0  Suicidal thoughts 0 0  PHQ-9 Score 12 7   GAD 7 : Generalized Anxiety Score 12/21/2019  Nervous, Anxious, on Edge 1  Control/stop worrying 1  Worry too much - different things 1  Trouble relaxing 1  Restless 0  Easily annoyed or irritable 2  Afraid - awful might happen 2  Total GAD 7 Score 8    Reviewed past Medical, Social and Family history today.  Outpatient Medications Prior to Visit  Medication Sig Dispense Refill  . Multiple Vitamins-Minerals (MULTIVITAMIN WITH MINERALS) tablet Take 1 tablet by mouth daily.    . Diclofenac Sodium 2 % SOLN Place 1 application onto the skin 2 (two) times daily as needed. (Patient not taking: Reported on 12/21/2019) 112 g 0  . ibuprofen (ADVIL,MOTRIN) 600 MG tablet Take 1 tablet (600 mg total) by mouth every 6 (six) hours as needed. (Patient not taking: Reported on 12/21/2019) 30 tablet 0  . ibuprofen (ADVIL,MOTRIN) 800 MG tablet Take 800 mg by mouth every 8 (eight) hours as needed. (Patient not taking: Reported on 12/21/2019)     No facility-administered medications prior to visit.    ROS See HPI  Objective:  BP 122/86 (BP Location: Left Arm, Patient Position: Sitting, Cuff Size: Normal)   Temp 97.7 F (36.5  C) (Temporal)   Ht 5' 6.75" (1.695 m)   Wt 187 lb 9.6 oz (85.1 kg)   BMI 29.60 kg/m   Physical Exam Vitals reviewed.  Constitutional:      Appearance: She is obese.  Cardiovascular:     Rate and Rhythm: Normal rate and regular rhythm.     Pulses: Normal pulses.     Heart sounds: Normal heart sounds.  Pulmonary:     Effort: Pulmonary effort is normal.     Breath sounds: Normal breath sounds.  Neurological:     Mental Status: She is alert and oriented to person, place, and time.  Psychiatric:        Mood and Affect: Mood normal.        Behavior: Behavior normal.        Thought Content: Thought content normal.    Physical Exam Vitals reviewed.  Constitutional:      Appearance: She is obese.  Cardiovascular:     Rate and Rhythm: Normal rate and regular rhythm.     Pulses: Normal pulses.     Heart sounds: Normal heart sounds.  Pulmonary:     Effort: Pulmonary effort is normal.     Breath sounds: Normal breath sounds.  Neurological:     Mental Status: She is alert and oriented to person, place, and time.  Psychiatric:        Mood and Affect: Mood normal.  Behavior: Behavior normal.        Thought Content: Thought content normal.    Assessment & Plan:  This visit occurred during the SARS-CoV-2 public health emergency.  Safety protocols were in place, including screening questions prior to the visit, additional usage of staff PPE, and extensive cleaning of exam room while observing appropriate contact time as indicated for disinfecting solutions.   Tina Swanson was seen today for establish care.  Diagnoses and all orders for this visit:  Premenstrual dysphoric disorder -     FLUoxetine (PROZAC) 10 MG capsule; Take 1 capsule (10 mg total) by mouth daily.   Problem List Items Addressed This Visit      Other   Premenstrual dysphoric disorder - Primary    Onset 3yrs ago, IUD in place(mirena)-unsure about date of insertion, has intermittent spotting. Mood swings,  pelvic and back pain (symptoms are waxing and waning monthly) Some improvement with naproxen.  Start prozac F/up in 91month      Relevant Medications   FLUoxetine (PROZAC) 10 MG capsule      Follow-up: Return in about 4 weeks (around 01/18/2020) for CPE (fasting).  Wilfred Lacy, NP

## 2019-12-21 NOTE — Patient Instructions (Addendum)
Premenstrual Syndrome Premenstrual syndrome (PMS) is a group of physical, emotional, and behavioral symptoms that affect women of childbearing age as part of their menstrual cycle. PMS starts 1-2 weeks before the start of a woman's menstrual period and goes away a few days after menstrual bleeding starts. It often happens in a predictable pattern (recurs). PMS may cause other health conditions to become worse, such as asthma, allergies, and migraines. PMS can range from mild to severe. When it is severe, it is called premenstrual dysphoric disorder (PMDD). PMS may interfere with normal daily activities. What are the causes? The cause of this condition is not known, but it seems to be related to hormone changes that happen before menstruation. What are the signs or symptoms? Symptoms of this condition often happen every month. They go away completely after your period starts. Physical symptoms of this condition include:  Bloating.  Breast pain.  Headaches.  Extreme fatigue.  Backaches.  Swelling of the hands and feet.  Weight gain.  Hot flashes. Emotional and behavioral symptoms of this condition include:  Mood swings.  Depression.  Angry outbursts.  Irritability.  Anxiety.  Crying spells.  Food cravings or appetite changes.  Changes in sexual desire.  Confusion.  Aggression.  Social withdrawal.  Poor concentration. How is this diagnosed? This condition may be diagnosed based on a history of your symptoms. This condition is generally diagnosed if symptoms of PMS:  Are present in the 5 days before your period starts.  End within 4 days after your period starts.  Happen at least 3 months in a row.  Interfere with some of your normal activities. Other conditions that can cause some of these symptoms must be ruled out before PMS can be diagnosed. These include depression, anxiety, anemia, and thyroid problems. How is this treated? This condition may be treated  by:  Maintaining a healthy lifestyle. This includes eating a well-balanced diet and exercising regularly.  Taking medicines. Medicines can help relieve symptoms such as cramps, aches, pains, headaches, and breast tenderness. Depending on the severity of the condition, your health care provider may recommend various over-the-counter pain medicines. Follow these instructions at home: Eating and drinking   Eat a well-balanced diet.  Avoid caffeine and alcohol.  Limit the amount of salt and salty foods you eat. This will help reduce bloating.  Drink enough fluid to keep your urine pale yellow.  Take a multivitamin if told to do so by your health care provider. Lifestyle   Do not use any products that contain nicotine or tobacco, such as cigarettes, e-cigarettes, and chewing tobacco. If you need help quitting, ask your health care provider.  Exercise regularly as suggested by your health care provider.  Get enough sleep. For most adults, this is 7-8 hours of sleep each night.  Practice relaxation techniques such as yoga, tai chi, or meditation.  Find healthy ways to manage stress. General instructions   For 2-3 months, write down your symptoms, their severity, and how long they last. This will help your health care provider choose the best treatment for you.  Take over-the-counter and prescription medicines only as told by your health care provider.  If you are using birth control pills (oral contraceptives), use them as told by your health care provider. Contact a health care provider if:  Your symptoms get worse.  You develop new symptoms.  You have trouble doing your daily activities. Summary  Premenstrual syndrome (PMS) is a group of physical, emotional, and behavioral symptoms that   affect women of childbearing age.  PMS starts 1-2 weeks before the start of a woman's period and goes away a few days after the period starts.  PMS is treated by maintaining a healthy  lifestyle and taking medicines to relieve the symptoms. This information is not intended to replace advice given to you by your health care provider. Make sure you discuss any questions you have with your health care provider. Document Revised: 11/26/2017 Document Reviewed: 11/26/2017 Elsevier Patient Education  2020 [employer]  

## 2020-01-07 ENCOUNTER — Other Ambulatory Visit: Payer: Self-pay | Admitting: Nurse Practitioner

## 2020-01-07 DIAGNOSIS — F3281 Premenstrual dysphoric disorder: Secondary | ICD-10-CM

## 2020-01-07 NOTE — Telephone Encounter (Signed)
Pharmacy is requesting a 90 day supply for ht Fluoxetine in stead of 30 days.  LR 12/21/19 5 RF's LOV 12/21/19  Please advise.  Thanks  Dm/cma

## 2020-08-21 ENCOUNTER — Telehealth: Payer: Self-pay | Admitting: Nurse Practitioner

## 2020-08-21 NOTE — Telephone Encounter (Signed)
She needs to call Medical examiner's office for their final report: [phone]

## 2020-08-25 NOTE — Telephone Encounter (Signed)
Pt is calling back stating this # is no longer valid. Is there an alternate # we can give her?

## 2020-08-28 NOTE — Telephone Encounter (Signed)
I called pt back and gave her the [place] # at [phone].

## 2020-08-28 NOTE — Telephone Encounter (Signed)
I do not know of any alternate numbers.

## 2020-12-22 ENCOUNTER — Encounter: Payer: Self-pay | Admitting: Nurse Practitioner

## 2020-12-22 ENCOUNTER — Other Ambulatory Visit: Payer: Self-pay

## 2020-12-22 ENCOUNTER — Ambulatory Visit (INDEPENDENT_AMBULATORY_CARE_PROVIDER_SITE_OTHER): Payer: Managed Care, Other (non HMO) | Admitting: Nurse Practitioner

## 2020-12-22 VITALS — BP 126/70 | Temp 97.9°F | Ht 65.5 in | Wt 179.2 lb

## 2020-12-22 DIAGNOSIS — F4321 Adjustment disorder with depressed mood: Secondary | ICD-10-CM

## 2020-12-22 DIAGNOSIS — G44209 Tension-type headache, unspecified, not intractable: Secondary | ICD-10-CM | POA: Diagnosis not present

## 2020-12-22 MED ORDER — EXCEDRIN MIGRAINE 250-250-65 MG PO TABS: 1.0000 | ORAL_TABLET | Freq: Four times a day (QID) | ORAL | 0 refills | Status: DC | PRN

## 2020-12-22 NOTE — Patient Instructions (Addendum)
Try magnesium glycinate '200mg'$  at bedtime as needed.  Tension Headache, Adult A tension headache is a feeling of pain, pressure, or aching over the front and sides of the head. The pain can be dull, or it can feel tight. There are two types of tension headache: Episodic tension headache. This is when the headaches happen fewer than 15 days a month. Chronic tension headache. This is when the headaches happen more than 15 days a month during a 66-monthperiod. A tension headache can last from 30 minutes to several days. It is the most common kind of headache. Tension headaches are not normally associated withnausea or vomiting, and they do not get worse with physical activity. What are the causes? The exact cause of this condition is not known. Tension headaches are often triggered by stress, anxiety, or depression. Other triggers may include: Alcohol. Too much caffeine or caffeine withdrawal. Respiratory infections, such as colds, flu, or sinus infections. Dental problems or teeth clenching. Fatigue. Holding your head and neck in the same position for a long period of time, such as while using a computer. Smoking. Arthritis of the neck. What are the signs or symptoms? Symptoms of this condition include: A feeling of pressure or tightness around the head. Dull, aching head pain. Pain over the front and sides of the head. Tenderness in the muscles of the head, neck, and shoulders. How is this diagnosed? This condition may be diagnosed based on your symptoms, your medical history,and a physical exam. If your symptoms are severe or unusual, you may have imaging tests, such as aCT scan or an MRI of your head. Your vision may also be checked. How is this treated? This condition may be treated with lifestyle changes and with medicines thathelp relieve symptoms. Follow these instructions at home: Managing pain Take over-the-counter and prescription medicines only as told by your health care  provider. When you have a headache, lie down in a dark, quiet room. If directed, put ice on your head and neck. To do this: Put ice in a plastic bag. Place a towel between your skin and the bag. Leave the ice on for 20 minutes, 2-3 times a day. Remove the ice if your skin turns bright red. This is very important. If you cannot feel pain, heat, or cold, you have a greater risk of damage to the area. If directed, apply heat to the back of your neck as often as told by your health care provider. Use the heat source that your health care provider recommends, such as a moist heat pack or a heating pad. Place a towel between your skin and the heat source. Leave the heat on for 20-30 minutes. Remove the heat if your skin turns bright red. This is especially important if you are unable to feel pain, heat, or cold. You have a greater risk of getting burned. Eating and drinking Eat meals on a regular schedule. If you drink alcohol: Limit how much you have to: 0-1 drink a day for women who are not pregnant. 0-2 drinks a day for men. Know how much alcohol is in your drink. In the [place] one drink equals one 12 oz bottle of beer (355 mL), one 5 oz glass of wine (148 mL), or one 1 oz glass of hard liquor (44 mL). Drink enough fluid to keep your urine pale yellow. Decrease your caffeine intake, or stop using caffeine. Lifestyle Get 7-9 hours of sleep each night, or get the amount of sleep recommended by your  health care provider. At bedtime, remove computers, phones, and tablets from your room. Find ways to manage your stress. This may include: Exercise. Deep breathing exercises. Yoga. Listening to music. Positive mental imagery. Try to sit up straight and avoid tensing your muscles. Do not use any products that contain nicotine or tobacco. These include cigarettes, chewing tobacco, and vaping devices, such as e-cigarettes. If you need help quitting, ask your health care provider. General  instructions  Avoid any headache triggers. Keep a journal to help find out what may trigger your headaches. For example, write down: What you eat and drink. How much sleep you get. Any change to your diet or medicines. Keep all follow-up visits. This is important.  Contact a health care provider if: Your headache does not get better. Your headache comes back. You are sensitive to sounds, light, or smells because of a headache. You have nausea or you vomit. Your stomach hurts. Get help right away if: You suddenly develop a severe headache, along with any of the following: A stiff neck. Nausea and vomiting. Confusion. Weakness in one part or one side of your body. Double vision or loss of vision. Shortness of breath. Rash. Unusual sleepiness. Fever or chills. Trouble speaking. Pain in your eye or ear. Trouble walking or balancing. Feeling faint or passing out. Summary A tension headache is a feeling of pain, pressure, or aching over the front and sides of the head. A tension headache can last from 30 minutes to several days. It is the most common kind of headache. This condition may be diagnosed based on your symptoms, your medical history, and a physical exam. This condition may be treated with lifestyle changes and with medicines that help relieve symptoms. This information is not intended to replace advice given to you by your health care provider. Make sure you discuss any questions you have with your healthcare provider. Document Revised: 01/13/2020 Document Reviewed: 01/13/2020 Elsevier Patient Education  2022 [employer].

## 2020-12-22 NOTE — Progress Notes (Signed)
Subjective:  Patient ID: JANESE WILTSE, female    DOB: [birth year]  Age: 45 y.o. MRN: [id number]  CC: Acute Visit (Pt c/o headaches, ear and bilateral arm pain for little over a month. )  Headache  This is a new problem. The current episode started more than 1 month ago. The problem occurs daily. The problem has been unchanged. The pain is located in the Frontal and occipital region. The pain does not radiate. The pain quality is not similar to prior headaches. The quality of the pain is described as dull and aching. Associated symptoms include insomnia. Pertinent negatives include no abdominal pain, abnormal behavior, anorexia, back pain, blurred vision, coughing, dizziness, drainage, ear pain, eye pain, eye redness, eye watering, facial sweating, fever, hearing loss, loss of balance, muscle aches, nausea, neck pain, numbness, phonophobia, photophobia, rhinorrhea, scalp tenderness, seizures, sinus pressure, sore throat, swollen glands, tingling, tinnitus, visual change, vomiting or weakness. The symptoms are aggravated by emotional stress. She has tried acetaminophen for the symptoms. The treatment provided mild relief. Her past medical history is significant for obesity. There is no history of cancer, cluster headaches, hypertension, immunosuppression, migraine headaches, migraines in the family or pseudotumor cerebri.  Depression      The patient presents with depression.  This is a chronic problem.  The current episode started more than 1 month ago.   The onset quality is gradual.   The problem occurs daily.  Associated symptoms include insomnia, body aches, myalgias, headaches and sad.  Associated symptoms include no decreased concentration, no fatigue, no helplessness, no hopelessness, not irritable, no restlessness, no decreased interest, no appetite change, no indigestion and no suicidal ideas.     The symptoms are aggravated by family issues (sudden death of her husband 33month ago).  Past  treatments include nothing.  Compliance with treatment is poor.  Past medical history includes anxiety and depression.     Pertinent negatives include , no chronic pain, no fibromyalgia, no hypothyroidism, no thyroid problem, no recent illness and no suicide attempts. Declined to take prozac and did not schedule appt with hospice home for Grief counseling.  Reviewed past Medical, Social and Family history today.  Outpatient Medications Prior to Visit  Medication Sig Dispense Refill   Multiple Vitamins-Minerals (MULTIVITAMIN WITH MINERALS) tablet Take 1 tablet by mouth daily.     Diclofenac Sodium 2 % SOLN Place 1 application onto the skin 2 (two) times daily as needed. (Patient not taking: No sig reported) 112 g 0   FLUoxetine (PROZAC) 10 MG capsule Take 1 capsule (10 mg total) by mouth daily. (Patient not taking: Reported on 12/22/2020) 30 capsule 5   No facility-administered medications prior to visit.    ROS See HPI  Objective:  BP 126/70 (BP Location: Left Arm, Patient Position: Sitting, Cuff Size: Normal)   Temp 97.9 F (36.6 C) (Temporal)   Ht 5' 5.5" (1.664 m)   Wt 179 lb 3.2 oz (81.3 kg)   BMI 29.37 kg/m   Physical Exam Vitals reviewed.  Constitutional:      General: She is not irritable. HENT:     Right Ear: Tympanic membrane, ear canal and external ear normal.     Left Ear: Tympanic membrane, ear canal and external ear normal.  Eyes:     Extraocular Movements: Extraocular movements intact.     Conjunctiva/sclera: Conjunctivae normal.  Cardiovascular:     Rate and Rhythm: Normal rate and regular rhythm.     Pulses: Normal pulses.  Heart sounds: Normal heart sounds.  Musculoskeletal:     Cervical back: Normal range of motion and neck supple.  Lymphadenopathy:     Cervical: No cervical adenopathy.  Neurological:     Mental Status: She is alert and oriented to person, place, and time.  Psychiatric:        Attention and Perception: Attention normal.        Mood  and Affect: Mood is depressed. Affect is tearful.        Speech: Speech normal.        Behavior: Behavior is cooperative.        Thought Content: Thought content normal.        Cognition and Memory: Cognition normal.        Judgment: Judgment normal.   Assessment & Plan:  This visit occurred during the SARS-CoV-2 public health emergency.  Safety protocols were in place, including screening questions prior to the visit, additional usage of staff PPE, and extensive cleaning of exam room while observing appropriate contact time as indicated for disinfecting solutions.   Moyra was seen today for acute visit.  Diagnoses and all orders for this visit:  Tension headache -     aspirin-acetaminophen-caffeine (EXCEDRIN MIGRAINE) 250-250-65 MG tablet; Take 1 tablet by mouth every 6 (six) hours as needed for headache.  Grief -     Ambulatory referral to Psychology  Problem List Items Addressed This Visit   None Visit Diagnoses     Tension headache    -  Primary   Relevant Medications   aspirin-acetaminophen-caffeine (EXCEDRIN MIGRAINE) [id number] MG tablet   Grief       Relevant Orders   Ambulatory referral to Psychology       Follow-up: Return in about 4 weeks (around 01/19/2021) for CPE (fasting), she wants 2pm appt.  Wilfred Lacy, NP

## 2021-02-13 ENCOUNTER — Encounter: Payer: Managed Care, Other (non HMO) | Admitting: Nurse Practitioner

## 2021-03-13 ENCOUNTER — Ambulatory Visit (INDEPENDENT_AMBULATORY_CARE_PROVIDER_SITE_OTHER): Payer: Managed Care, Other (non HMO) | Admitting: Nurse Practitioner

## 2021-03-13 ENCOUNTER — Other Ambulatory Visit: Payer: Self-pay

## 2021-03-13 ENCOUNTER — Encounter: Payer: Self-pay | Admitting: Nurse Practitioner

## 2021-03-13 VITALS — BP 130/80 | HR 80 | Temp 96.7°F | Ht 66.75 in | Wt 181.2 lb

## 2021-03-13 DIAGNOSIS — G44209 Tension-type headache, unspecified, not intractable: Secondary | ICD-10-CM

## 2021-03-13 DIAGNOSIS — Z13220 Encounter for screening for lipoid disorders: Secondary | ICD-10-CM

## 2021-03-13 DIAGNOSIS — Z0001 Encounter for general adult medical examination with abnormal findings: Secondary | ICD-10-CM

## 2021-03-13 DIAGNOSIS — Z91018 Allergy to other foods: Secondary | ICD-10-CM | POA: Diagnosis not present

## 2021-03-13 DIAGNOSIS — E7800 Pure hypercholesterolemia, unspecified: Secondary | ICD-10-CM | POA: Diagnosis not present

## 2021-03-13 DIAGNOSIS — Z1211 Encounter for screening for malignant neoplasm of colon: Secondary | ICD-10-CM

## 2021-03-13 LAB — CBC WITH DIFFERENTIAL/PLATELET
Basophils Absolute: 0 10*3/uL (ref 0.0–0.1)
Basophils Relative: 0.1 % (ref 0.0–3.0)
Eosinophils Absolute: 0 10*3/uL (ref 0.0–0.7)
Eosinophils Relative: 0.9 % (ref 0.0–5.0)
HCT: 37.8 % (ref 36.0–46.0)
Hemoglobin: 12.2 g/dL (ref 12.0–15.0)
Lymphocytes Relative: 39.7 % (ref 12.0–46.0)
Lymphs Abs: 1.3 10*3/uL (ref 0.7–4.0)
MCHC: 32.4 g/dL (ref 30.0–36.0)
MCV: 84.4 fl (ref 78.0–100.0)
Monocytes Absolute: 0.2 10*3/uL (ref 0.1–1.0)
Monocytes Relative: 5.7 % (ref 3.0–12.0)
Neutro Abs: 1.8 10*3/uL (ref 1.4–7.7)
Neutrophils Relative %: 53.6 % (ref 43.0–77.0)
Platelets: 303 10*3/uL (ref 150.0–400.0)
RBC: 4.48 Mil/uL (ref 3.87–5.11)
RDW: 12.6 % (ref 11.5–15.5)
WBC: 3.4 10*3/uL — ABNORMAL LOW (ref 4.0–10.5)

## 2021-03-13 LAB — LIPID PANEL
Cholesterol: 215 mg/dL — ABNORMAL HIGH (ref 0–200)
HDL: 50.4 mg/dL (ref >39.00)
LDL Cholesterol: 150 mg/dL — ABNORMAL HIGH (ref 0–99)
NonHDL: 164.96
Total CHOL/HDL Ratio: 4
Triglycerides: 76 mg/dL (ref 0.0–149.0)
VLDL: 15.2 mg/dL (ref 0.0–40.0)

## 2021-03-13 LAB — COMPREHENSIVE METABOLIC PANEL
ALT: 11 U/L (ref 0–35)
AST: 14 U/L (ref 0–37)
Albumin: 4.2 g/dL (ref 3.5–5.2)
Alkaline Phosphatase: 60 U/L (ref 39–117)
BUN: 12 mg/dL (ref 6–23)
CO2: 27 mEq/L (ref 19–32)
Calcium: 9.2 mg/dL (ref 8.4–10.5)
Chloride: 103 mEq/L (ref 96–112)
Creatinine, Ser: 0.82 mg/dL (ref 0.40–1.20)
GFR: 85.92 mL/min (ref >60.00)
Glucose, Bld: 89 mg/dL (ref 70–99)
Potassium: 3.9 mEq/L (ref 3.5–5.1)
Sodium: 136 mEq/L (ref 135–145)
Total Bilirubin: 0.4 mg/dL (ref 0.2–1.2)
Total Protein: 7.2 g/dL (ref 6.0–8.3)

## 2021-03-13 LAB — TSH: TSH: 0.92 u[IU]/mL (ref 0.35–5.50)

## 2021-03-13 MED ORDER — EPINEPHRINE 0.3 MG/0.3ML IJ SOAJ: 0.3000 mg | INTRAMUSCULAR | 0 refills | Status: DC | PRN

## 2021-03-13 MED ORDER — SUMATRIPTAN SUCCINATE 25 MG PO TABS: 25.0000 mg | ORAL_TABLET | ORAL | 0 refills | Status: DC | PRN

## 2021-03-13 NOTE — Patient Instructions (Addendum)
Tums or pepcid or prilosec at bedtime. Add probiotic: florastor or align 1cap daily.  Go to lab for blood draw.  You will be contacted to schedule appt with GI Schedule appt with psychology  Have PAP and mammogram reports faxed to me once completed

## 2021-03-13 NOTE — Assessment & Plan Note (Signed)
Throat itching and swelling with eating onions Provided epipen rx

## 2021-03-13 NOTE — Progress Notes (Signed)
Subjective:    Patient ID: Tina Swanson, female    DOB: [birth year], 45 y.o.   MRN: [id number]  Patient presents today for CPE and eval of chronic conditions  HPI Tension headache Decrease in frequency. Headache once a month with menstrual cycle. Minimal improvement with excedrin  Sent imitrex for abortive therapy  Food allergy Throat itching and swelling with eating onions Provided epipen rx  Vision:up to date Dental:will schedule Diet:regular Exercise:none Weight:  Wt Readings from Last 3 Encounters:  03/13/21 181 lb 3.2 oz (82.2 kg)  12/22/20 179 lb 3.2 oz (81.3 kg)  12/21/19 187 lb 9.6 oz (85.1 kg)   Sexual History (orientation,birth control, marital status, STD):upcoming appt with GYN: Saura Silverbell for mammogram, breast and pap smear.  Depression/Suicide: Depression screen [facility] 2/9 03/13/2021 12/22/2020 12/21/2019 05/30/2016  Decreased Interest 2 3 1 2   Down, Depressed, Hopeless 3 3 2 2   PHQ - 2 Score 5 6 3 4   Altered sleeping 2 3 1 1   Tired, decreased energy 2 2 3 1   Change in appetite 1 3 - 0  Feeling bad or failure about yourself  3 3 3  0  Trouble concentrating 2 1 2 1   Moving slowly or fidgety/restless 0 0 0 0  Suicidal thoughts 1 0 0 0  PHQ-9 Score 16 18 12 7   Difficult doing work/chores Somewhat difficult Somewhat difficult - -   Immunizations: (TDAP, Hep C screen, Pneumovax, Influenza, zoster)  Health Maintenance  Topic Date Due   HIV Screening  Never done   Hepatitis C Screening: USPSTF Recommendation to screen - Ages 67-79 yo.  Never done   Pap Smear  Never done   COVID-19 Vaccine (3 - Booster for Pfizer series) 10/25/2019   Colon Cancer Screening  Never done   Flu Shot  07/27/2021*   Tetanus Vaccine  05/30/2026   Pneumococcal Vaccination  Aged Out   HPV Vaccine  Aged Out  *Topic was postponed. The date shown is not the original due date.   Fall Risk: Fall Risk  03/13/2021 05/30/2016  Falls in the past year? 0 No  Number falls in past  yr: 0 -  Injury with Fall? 0 -  Risk for fall due to : No Fall Risks -  Follow up Falls evaluation completed -   Medications and allergies reviewed with patient and updated if appropriate.  Patient Active Problem List   Diagnosis Date Noted   Tension headache 03/13/2021   Food allergy 03/13/2021   Premenstrual dysphoric disorder 12/21/2019   Genital herpes simplex 12/21/2019   Pruritus of vulva 12/21/2019   Uterine leiomyoma 04/28/2019    Current Outpatient Medications on File Prior to Visit  Medication Sig Dispense Refill   Multiple Vitamins-Minerals (MULTIVITAMIN WITH MINERALS) tablet Take 1 tablet by mouth daily.     No current facility-administered medications on file prior to visit.    Past Medical History:  Diagnosis Date   Chicken pox    Migraines     History reviewed. No pertinent surgical history.  Social History   Socioeconomic History   Marital status: Married    Spouse name: Not on file   Number of children: Not on file   Years of education: Not on file   Highest education level: Not on file  Occupational History   Not on file  Tobacco Use   Smoking status: Never   Smokeless tobacco: Never  Vaping Use   Vaping Use: Never used  Substance and Sexual Activity  Alcohol use: Yes    Comment: social use   Drug use: No   Sexual activity: Yes    Birth control/protection: I.U.D.  Other Topics Concern   Not on file  Social History Narrative   Not on file   Social Determinants of Health   Financial Resource Strain: Not on file  Food Insecurity: Not on file  Transportation Needs: Not on file  Physical Activity: Not on file  Stress: Not on file  Social Connections: Not on file    Family History  Problem Relation Age of Onset   Heart disease Mother 35       MI and CAD and death   Hypertension Mother    Alcohol abuse Father    Cancer Father 40       leukemia   Cancer Maternal Grandmother 27       ovarian   Diabetes Brother        Review  of Systems  Constitutional:  Negative for fever, malaise/fatigue and weight loss.  HENT:  Negative for congestion and sore throat.   Eyes:        Negative for visual changes  Respiratory:  Negative for cough and shortness of breath.   Cardiovascular:  Negative for chest pain, palpitations and leg swelling.  Gastrointestinal:  Negative for blood in stool, constipation, diarrhea and heartburn.  Genitourinary:  Negative for dysuria, frequency and urgency.  Musculoskeletal:  Negative for falls, joint pain and myalgias.  Skin:  Negative for rash.  Neurological:  Negative for dizziness, sensory change and headaches.  Endo/Heme/Allergies:  Does not bruise/bleed easily.  Psychiatric/Behavioral:  Positive for depression. Negative for substance abuse and suicidal ideas. The patient is nervous/anxious.    Objective:   Vitals:   03/13/21 1300  BP: 130/80  Pulse: 80  Temp: (!) 96.7 F (35.9 C)   Body mass index is 28.59 kg/m.  Physical Examination:  Physical Exam Vitals reviewed.  Constitutional:      General: She is not in acute distress.    Appearance: She is well-developed.  HENT:     Right Ear: Tympanic membrane, ear canal and external ear normal.     Left Ear: Tympanic membrane, ear canal and external ear normal.  Eyes:     Extraocular Movements: Extraocular movements intact.     Conjunctiva/sclera: Conjunctivae normal.  Cardiovascular:     Rate and Rhythm: Normal rate and regular rhythm.     Pulses: Normal pulses.     Heart sounds: Normal heart sounds.  Pulmonary:     Effort: Pulmonary effort is normal. No respiratory distress.     Breath sounds: Normal breath sounds.  Chest:     Chest wall: No tenderness.  Abdominal:     General: Bowel sounds are normal.     Palpations: Abdomen is soft.  Genitourinary:    Comments: Deferred breast and pelvic exam to GYN per patient Musculoskeletal:        General: Normal range of motion.     Cervical back: Normal range of motion and  neck supple.  Skin:    General: Skin is warm and dry.  Neurological:     Mental Status: She is alert and oriented to person, place, and time.     Deep Tendon Reflexes: Reflexes are normal and symmetric.   ASSESSMENT and PLAN: This visit occurred during the SARS-CoV-2 public health emergency.  Safety protocols were in place, including screening questions prior to the visit, additional usage of staff PPE, and extensive cleaning  of exam room while observing appropriate contact time as indicated for disinfecting solutions.   Kawthar was seen today for annual exam.  Diagnoses and all orders for this visit:  Encounter for preventative adult health care exam with abnormal findings -     Comprehensive metabolic panel -     CBC with Differential/Platelet -     Lipid panel -     TSH -     Ambulatory referral to Gastroenterology  Food allergy -     EPINEPHrine 0.3 mg/0.3 mL IJ SOAJ injection; Inject 0.3 mg into the muscle as needed for anaphylaxis.  Encounter for lipid screening for cardiovascular disease -     Lipid panel  Tension headache -     SUMAtriptan (IMITREX) 25 MG tablet; Take 1 tablet (25 mg total) by mouth every 2 (two) hours as needed for migraine. May repeat in 2 hours if headache persists or recurs.  Colon cancer screening -     Ambulatory referral to Gastroenterology       Problem List Items Addressed This Visit       Other   Food allergy    Throat itching and swelling with eating onions Provided epipen rx      Relevant Medications   EPINEPHrine 0.3 mg/0.3 mL IJ SOAJ injection   Tension headache    Decrease in frequency. Headache once a month with menstrual cycle. Minimal improvement with excedrin  Sent imitrex for abortive therapy      Relevant Medications   SUMAtriptan (IMITREX) 25 MG tablet   Other Visit Diagnoses     Encounter for preventative adult health care exam with abnormal findings    -  Primary   Relevant Orders   Comprehensive metabolic  panel   CBC with Differential/Platelet   Lipid panel   TSH   Ambulatory referral to Gastroenterology   Encounter for lipid screening for cardiovascular disease       Relevant Orders   Lipid panel   Colon cancer screening       Relevant Orders   Ambulatory referral to Gastroenterology       Follow up: Return in about 1 year (around 03/13/2022) for CPE (fasting).  Wilfred Lacy, NP

## 2021-03-13 NOTE — Assessment & Plan Note (Signed)
Decrease in frequency. Headache once a month with menstrual cycle. Minimal improvement with excedrin  Sent imitrex for abortive therapy

## 2021-03-14 DIAGNOSIS — E7800 Pure hypercholesterolemia, unspecified: Secondary | ICD-10-CM | POA: Insufficient documentation

## 2021-03-14 NOTE — Addendum Note (Signed)
Addended by: Wilfred Lacy L on: 03/14/2021 12:20 PM   Modules accepted: Orders

## 2021-04-21 ENCOUNTER — Other Ambulatory Visit: Payer: Self-pay | Admitting: Nurse Practitioner

## 2021-04-21 DIAGNOSIS — G44209 Tension-type headache, unspecified, not intractable: Secondary | ICD-10-CM

## 2021-04-25 LAB — HM PAP SMEAR

## 2021-07-13 ENCOUNTER — Telehealth: Payer: Self-pay | Admitting: Nurse Practitioner

## 2021-07-13 NOTE — Telephone Encounter (Signed)
Pt states she would like to discuss a personal matter with Charlotte [name]-NP. When asked for details to relay to the provider she states she would rather not give me any information and would like the provider to call her. I offered to schedule a appointment for the patient and she informed me what she wanted to discuss would not take long and she did not need a appointment. Please advise ?

## 2021-07-13 NOTE — Telephone Encounter (Signed)
Pt requesting a call back, she has questions about her labs. [phone] ?

## 2021-07-16 NOTE — Telephone Encounter (Signed)
Informed me about husband's autopsy report. ?Also schedule lab appt for repeat lipid panel. ?

## 2021-07-18 ENCOUNTER — Other Ambulatory Visit (INDEPENDENT_AMBULATORY_CARE_PROVIDER_SITE_OTHER): Payer: 59

## 2021-07-18 ENCOUNTER — Other Ambulatory Visit: Payer: Self-pay

## 2021-07-18 DIAGNOSIS — E7800 Pure hypercholesterolemia, unspecified: Secondary | ICD-10-CM | POA: Diagnosis not present

## 2021-07-18 LAB — LIPID PANEL
Cholesterol: 203 mg/dL — ABNORMAL HIGH (ref 0–200)
HDL: 53.1 mg/dL (ref >39.00)
LDL Cholesterol: 135 mg/dL — ABNORMAL HIGH (ref 0–99)
NonHDL: 149.5
Total CHOL/HDL Ratio: 4
Triglycerides: 71 mg/dL (ref 0.0–149.0)
VLDL: 14.2 mg/dL (ref 0.0–40.0)

## 2021-07-18 NOTE — Progress Notes (Signed)
Per the orders of  Tina Swanson pt is here for labs, repeat lipid 6 mo fasting. pt tolerated draw well. ? ?

## 2022-01-10 ENCOUNTER — Ambulatory Visit
Admission: EM | Admit: 2022-01-10 | Discharge: 2022-01-10 | Disposition: A | Discharge status: Home / Self Care | Payer: Managed Care, Other (non HMO) | Attending: Internal Medicine | Admitting: Internal Medicine

## 2022-01-10 ENCOUNTER — Encounter: Payer: Self-pay | Admitting: Emergency Medicine

## 2022-01-10 ENCOUNTER — Other Ambulatory Visit: Payer: Self-pay

## 2022-01-10 DIAGNOSIS — J029 Acute pharyngitis, unspecified: Secondary | ICD-10-CM

## 2022-01-10 DIAGNOSIS — U071 COVID-19: Secondary | ICD-10-CM | POA: Insufficient documentation

## 2022-01-10 DIAGNOSIS — J069 Acute upper respiratory infection, unspecified: Secondary | ICD-10-CM

## 2022-01-10 LAB — POCT RAPID STREP A (OFFICE): Rapid Strep A Screen: NEGATIVE

## 2022-01-10 LAB — RESP PANEL BY RT-PCR (FLU A&B, COVID) ARPGX2
Influenza A by PCR: NEGATIVE
Influenza B by PCR: NEGATIVE
SARS Coronavirus 2 by RT PCR: POSITIVE — AB

## 2022-01-10 MED ORDER — CHLORASEPTIC 1.4 % MT LIQD: 1.0000 | OROMUCOSAL | 0 refills | Status: DC | PRN

## 2022-01-10 MED ORDER — BENZONATATE 100 MG PO CAPS: 100.0000 mg | ORAL_CAPSULE | Freq: Three times a day (TID) | ORAL | 0 refills | Status: DC | PRN

## 2022-01-10 MED ORDER — FLUTICASONE PROPIONATE 50 MCG/ACT NA SUSP: 1.0000 | Freq: Every day | NASAL | 0 refills | Status: AC

## 2022-01-10 NOTE — ED Provider Notes (Addendum)
[facility]    CSN: [id number] Arrival date & time: 01/10/22  0850      History   Chief Complaint Chief Complaint  Patient presents with   Sore Throat    HPI Tina Swanson is a 46 y.o. female.   Patient presents with sore throat, nasal congestion, nonproductive cough, generalized body aches that started about 3 days ago.  Denies any known sick contacts or fevers at home.  Denies chest pain, shortness of breath, ear pain, nausea, vomiting, diarrhea, abdominal pain.  Patient has taken over-the-counter medication with minimal improvement of symptoms.   Sore Throat    Past Medical History:  Diagnosis Date   Chicken pox    Migraines     Patient Active Problem List   Diagnosis Date Noted   Pure hypercholesterolemia 03/14/2021   Tension headache 03/13/2021   Food allergy 03/13/2021   Premenstrual dysphoric disorder 12/21/2019   Genital herpes simplex 12/21/2019   Pruritus of vulva 12/21/2019   Uterine leiomyoma 04/28/2019    History reviewed. No pertinent surgical history.  OB History   No obstetric history on file.      Home Medications    Prior to Admission medications   Medication Sig Start Date End Date Taking? Authorizing Provider  benzonatate (TESSALON) 100 MG capsule Take 1 capsule (100 mg total) by mouth every 8 (eight) hours as needed for cough. 01/10/22  Yes Marsean Elkhatib, Michele Rockers, FNP  fluticasone (FLONASE) 50 MCG/ACT nasal spray Place 1 spray into both nostrils daily for 3 days. 01/10/22 01/13/22 Yes Autymn Omlor, Nikkolas Coomes E, FNP  phenol (CHLORASEPTIC) 1.4 % LIQD Use as directed 1 spray in the mouth or throat as needed for throat irritation / pain. 01/10/22  Yes Eriyana Sweeten, Hildred Alamin E, FNP  EPINEPHrine 0.3 mg/0.3 mL IJ SOAJ injection Inject 0.3 mg into the muscle as needed for anaphylaxis. 03/13/21   [name], Charlene Brooke, NP  Multiple Vitamins-Minerals (MULTIVITAMIN WITH MINERALS) tablet Take 1 tablet by mouth daily.    [provider]  SUMAtriptan  (IMITREX) 25 MG tablet TAKE 1 TAB BY MOUTH EVERY 2 HOURS AS NEEDED FOR MIGRAINE. MAY REPEAT IN 2 HOURS IF HEADACHE PERSISTS OR RECURS 04/24/21   [name], Charlene Brooke, NP    Family History Family History  Problem Relation Age of Onset   Heart disease Mother 51       MI and CAD and death   Hypertension Mother    Alcohol abuse Father    Cancer Father 65       leukemia   Cancer Maternal Grandmother 30       ovarian   Diabetes Brother     Social History Social History   Tobacco Use   Smoking status: Never   Smokeless tobacco: Never  Vaping Use   Vaping Use: Never used  Substance Use Topics   Alcohol use: Yes    Comment: social use   Drug use: No     Allergies   Patient has no known allergies.   Review of Systems Review of Systems Per HPI  Physical Exam Triage Vital Signs ED Triage Vitals [01/10/22 0925]  Enc Vitals Group     BP (!) 148/102     Pulse Rate (!) 102     Resp 18     Temp 99.4 F (37.4 C)     Temp Source Oral     SpO2 97 %     Weight      Height      Head  Circumference      Peak Flow      Pain Score 4     Pain Loc      Pain Edu?      Excl. in [place]?    No data found.  Updated Vital Signs BP (!) 148/102 (BP Location: Left Arm)   Pulse 95   Temp 99.4 F (37.4 C) (Oral)   Resp 18   SpO2 97%   Visual Acuity Right Eye Distance:   Left Eye Distance:   Bilateral Distance:    Right Eye Near:   Left Eye Near:    Bilateral Near:     Physical Exam Constitutional:      General: She is not in acute distress.    Appearance: Normal appearance. She is not toxic-appearing or diaphoretic.  HENT:     Head: Normocephalic and atraumatic.     Right Ear: Tympanic membrane and ear canal normal.     Left Ear: Tympanic membrane and ear canal normal.     Nose: Congestion present.     Mouth/Throat:     Mouth: Mucous membranes are moist.     Pharynx: Posterior oropharyngeal erythema present. No pharyngeal swelling, oropharyngeal exudate or uvula  swelling.     Tonsils: No tonsillar exudate or tonsillar abscesses.  Eyes:     Extraocular Movements: Extraocular movements intact.     Conjunctiva/sclera: Conjunctivae normal.     Pupils: Pupils are equal, round, and reactive to light.  Cardiovascular:     Rate and Rhythm: Normal rate and regular rhythm.     Pulses: Normal pulses.     Heart sounds: Normal heart sounds.  Pulmonary:     Effort: Pulmonary effort is normal. No respiratory distress.     Breath sounds: Normal breath sounds. No stridor. No wheezing, rhonchi or rales.  Abdominal:     General: Abdomen is flat. Bowel sounds are normal.     Palpations: Abdomen is soft.  Musculoskeletal:        General: Normal range of motion.     Cervical back: Normal range of motion.  Skin:    General: Skin is warm and dry.  Neurological:     General: No focal deficit present.     Mental Status: She is alert and oriented to person, place, and time. Mental status is at baseline.  Psychiatric:        Mood and Affect: Mood normal.        Behavior: Behavior normal.      UC Treatments / Results  Labs (all labs ordered are listed, but only abnormal results are displayed) Labs Reviewed  CULTURE, GROUP A STREP ([place])  RESP PANEL BY RT-PCR (FLU A&B, COVID) ARPGX2  POCT RAPID STREP A (OFFICE)    EKG   Radiology No results found.  Procedures Procedures (including critical care time)  Medications Ordered in UC Medications - No data to display  Initial Impression / Assessment and Plan / UC Course  I have reviewed the triage vital signs and the nursing notes.  Pertinent labs & imaging results that were available during my care of the patient were reviewed by me and considered in my medical decision making (see chart for details).     Patient presents with symptoms likely from a viral upper respiratory infection. Differential includes bacterial pneumonia, sinusitis, allergic rhinitis, Covid 19, flu. Do not suspect underlying  cardiopulmonary process. Symptoms seem unlikely related to ACS, CHF or COPD exacerbations, pneumonia, pneumothorax. Patient is nontoxic appearing and not  in need of emergent medical intervention.  Rapid strep was negative.  Throat culture and COVID test pending. Suspect tachycardia is related to low grade temp.   Recommended symptom control with over the counter medications.  Patient sent prescriptions.  Return if symptoms fail to improve in 1-2 weeks or you develop shortness of breath, chest pain, severe headache. Patient states understanding and is agreeable.  Discharged with PCP followup.  Final Clinical Impressions(s) / UC Diagnoses   Final diagnoses:  Viral upper respiratory tract infection with cough  Sore throat     Discharge Instructions      Strep was negative.  Throat culture and COVID test pending.  We will call if it is positive.  It appears that you have a viral upper respiratory infection that should run its course and self resolve with symptomatic treatment.  Please follow-up if symptoms persist or worsen.    ED Prescriptions     Medication Sig Dispense Auth. Provider   fluticasone (FLONASE) 50 MCG/ACT nasal spray Place 1 spray into both nostrils daily for 3 days. 16 g Ulyssa Walthour, Hildred Alamin E, [place]   phenol (CHLORASEPTIC) 1.4 % LIQD Use as directed 1 spray in the mouth or throat as needed for throat irritation / pain. 118 mL Merrie Epler, [place] E, [place]   benzonatate (TESSALON) 100 MG capsule Take 1 capsule (100 mg total) by mouth every 8 (eight) hours as needed for cough. 21 capsule [place], Michele Rockers, [place]      PDMP not reviewed this encounter.   Teodora Medici, [place] 01/10/22 [place], [place], [place] 01/10/22 1014

## 2022-01-10 NOTE — Discharge Instructions (Signed)
Strep was negative.  Throat culture and COVID test pending.  We will call if it is positive.  It appears that you have a viral upper respiratory infection that should run its course and self resolve with symptomatic treatment.  Please follow-up if symptoms persist or worsen.

## 2022-01-10 NOTE — ED Triage Notes (Signed)
Pt here for sore throat and body aches x 2 days

## 2022-01-13 LAB — CULTURE, GROUP A STREP (THRC)

## 2023-05-26 ENCOUNTER — Ambulatory Visit (INDEPENDENT_AMBULATORY_CARE_PROVIDER_SITE_OTHER): Payer: Managed Care, Other (non HMO) | Admitting: Internal Medicine

## 2023-05-26 ENCOUNTER — Encounter: Payer: Self-pay | Admitting: Internal Medicine

## 2023-05-26 VITALS — BP 146/98 | HR 97 | Temp 98.2°F | Ht 66.75 in | Wt 187.8 lb

## 2023-05-26 DIAGNOSIS — I10 Essential (primary) hypertension: Secondary | ICD-10-CM | POA: Diagnosis not present

## 2023-05-26 DIAGNOSIS — E7800 Pure hypercholesterolemia, unspecified: Secondary | ICD-10-CM | POA: Diagnosis not present

## 2023-05-26 DIAGNOSIS — Z1211 Encounter for screening for malignant neoplasm of colon: Secondary | ICD-10-CM

## 2023-05-26 LAB — COMPREHENSIVE METABOLIC PANEL
ALT: 19 U/L (ref 0–35)
AST: 26 U/L (ref 0–37)
Albumin: 4.2 g/dL (ref 3.5–5.2)
Alkaline Phosphatase: 60 U/L (ref 39–117)
BUN: 8 mg/dL (ref 6–23)
CO2: 28 meq/L (ref 19–32)
Calcium: 9.2 mg/dL (ref 8.4–10.5)
Chloride: 103 meq/L (ref 96–112)
Creatinine, Ser: 0.74 mg/dL (ref 0.40–1.20)
GFR: 95.7 mL/min (ref >60.00)
Glucose, Bld: 82 mg/dL (ref 70–99)
Potassium: 3.5 meq/L (ref 3.5–5.1)
Sodium: 137 meq/L (ref 135–145)
Total Bilirubin: 0.3 mg/dL (ref 0.2–1.2)
Total Protein: 7.4 g/dL (ref 6.0–8.3)

## 2023-05-26 LAB — CBC WITH DIFFERENTIAL/PLATELET
Basophils Absolute: 0 10*3/uL (ref 0.0–0.1)
Basophils Relative: 0.2 % (ref 0.0–3.0)
Eosinophils Absolute: 0 10*3/uL (ref 0.0–0.7)
Eosinophils Relative: 1.2 % (ref 0.0–5.0)
HCT: 40.6 % (ref 36.0–46.0)
Hemoglobin: 13 g/dL (ref 12.0–15.0)
Lymphocytes Relative: 42.7 % (ref 12.0–46.0)
Lymphs Abs: 1.7 10*3/uL (ref 0.7–4.0)
MCHC: 32 g/dL (ref 30.0–36.0)
MCV: 85.7 fL (ref 78.0–100.0)
Monocytes Absolute: 0.3 10*3/uL (ref 0.1–1.0)
Monocytes Relative: 7.1 % (ref 3.0–12.0)
Neutro Abs: 1.9 10*3/uL (ref 1.4–7.7)
Neutrophils Relative %: 48.8 % (ref 43.0–77.0)
Platelets: 293 10*3/uL (ref 150.0–400.0)
RBC: 4.74 Mil/uL (ref 3.87–5.11)
RDW: 12.9 % (ref 11.5–15.5)
WBC: 3.9 10*3/uL — ABNORMAL LOW (ref 4.0–10.5)

## 2023-05-26 LAB — TSH: TSH: 0.73 u[IU]/mL (ref 0.35–5.50)

## 2023-05-26 MED ORDER — LOSARTAN POTASSIUM 50 MG PO TABS: 50.0000 mg | ORAL_TABLET | Freq: Every day | ORAL | 0 refills | Status: DC

## 2023-05-26 NOTE — Patient Instructions (Signed)
BLOOD PRESSURE: Obtain an automatic blood pressure machine if you do not have one.   Check your blood pressure at home, write down blood pressure readings and bring to next appointment.  Goal is BP less than 140/90 consistently.  Adhere to a low salt diet ( no more than 1500mg  of salt/sodium per day) and exercise regularly   The nutrition facts label is a good place to find how much sodium is in foods. Look for products with no more than 400 mg of sodium per serving.  Remember that 1.5 g = 1500 mg.  The food label may also list foods as:  Sodium-free: Less than 5 mg in a serving.  Very low sodium: 35 mg or less in a serving.  Low-sodium: 140 mg or less in a serving.  Light in sodium: 50% less sodium in a serving. For example, if a food that usually has 300 mg of sodium is changed to become light in sodium, it will have 150 mg of sodium.  Reduced sodium: 25% less sodium in a serving. For example, if a food that usually has 400 mg of sodium is changed to reduced sodium, it will have 300 mg of sodium.

## 2023-05-26 NOTE — Progress Notes (Signed)
[facility] [address] [place] [address] Dept: [phone] Dept Fax: [phone]  Acute Care Office Visit  Subjective:   Tina Swanson 12/04/1975 05/26/2023  Chief Complaint  Patient presents with   Headache    Started a couple month ago   Hypertension    Noticed over the past year    HPI: Discussed the use of AI scribe software for clinical note transcription with the patient, who gave verbal consent to proceed.  History of Present Illness   The patient with history of HLD, presents with headaches and elevated blood pressure. The patient reports experiencing headaches, described as pressure in the back of the neck and head, along with sharp pains. The headaches are intermittent and sometimes relieved by [facility] powder. The patient has noticed elevated blood pressure during recent doctor visits at [facility], with a recorded reading of 149/105.  She does report intermittent chest pain, denies any at this time. The patient denies shortness of breath.      The following portions of the patient's history were reviewed and updated as appropriate: past medical history, past surgical history, family history, social history, allergies, medications, and problem list.   Patient Active Problem List   Diagnosis Date Noted   Primary hypertension 05/26/2023   Pure hypercholesterolemia 03/14/2021   Tension headache 03/13/2021   Food allergy 03/13/2021   Premenstrual dysphoric disorder 12/21/2019   Genital herpes simplex 12/21/2019   Pruritus of vulva 12/21/2019   Uterine leiomyoma 04/28/2019   Past Medical History:  Diagnosis Date   Chicken pox    Migraines    History reviewed. No pertinent surgical history. Family History  Problem Relation Age of Onset   Heart disease Mother 41       MI and CAD and death   Hypertension Mother    Alcohol abuse Father    Cancer Father 68       leukemia   Cancer Maternal Grandmother 23        ovarian   Diabetes Brother     Current Outpatient Medications:    EPINEPHrine 0.3 mg/0.3 mL IJ SOAJ injection, Inject 0.3 mg into the muscle as needed for anaphylaxis., Disp: 1 each, Rfl: 0   losartan (COZAAR) 50 MG tablet, Take 1 tablet (50 mg total) by mouth daily., Disp: 30 tablet, Rfl: 0   Multiple Vitamins-Minerals (MULTIVITAMIN WITH MINERALS) tablet, Take 1 tablet by mouth daily., Disp: , Rfl:    benzonatate (TESSALON) 100 MG capsule, Take 1 capsule (100 mg total) by mouth every 8 (eight) hours as needed for cough. (Patient not taking: Reported on 05/26/2023), Disp: 21 capsule, Rfl: 0   fluticasone (FLONASE) 50 MCG/ACT nasal spray, Place 1 spray into both nostrils daily for 3 days., Disp: 16 g, Rfl: 0   phenol (CHLORASEPTIC) 1.4 % LIQD, Use as directed 1 spray in the mouth or throat as needed for throat irritation / pain. (Patient not taking: Reported on 05/26/2023), Disp: 118 mL, Rfl: 0   SUMAtriptan (IMITREX) 25 MG tablet, TAKE 1 TAB BY MOUTH EVERY 2 HOURS AS NEEDED FOR MIGRAINE. MAY REPEAT IN 2 HOURS IF HEADACHE PERSISTS OR RECURS (Patient not taking: Reported on 05/26/2023), Disp: 9 tablet, Rfl: 1 No Known Allergies   ROS: A complete ROS was performed with pertinent positives/negatives noted in the HPI. The remainder of the ROS are negative.    Objective:   Today's Vitals   05/26/23 1311  BP: (!) 146/98  Pulse: 97  Temp:  98.2 F (36.8 C)  TempSrc: Temporal  SpO2: 97%  Weight: 187 lb 12.8 oz (85.2 kg)  Height: 5' 6.75" (1.695 m)    GENERAL: Well-appearing, in NAD. Well nourished.  SKIN: Pink, warm and dry. No rash, lesion, ulceration, or ecchymoses.  NECK: Trachea midline. Full ROM w/o pain or tenderness. No lymphadenopathy. No thyromegaly or palpable masses.  RESPIRATORY: Chest wall symmetrical. Respirations even and non-labored. Breath sounds clear to auscultation bilaterally.  CARDIAC: S1, S2 present, regular rate and rhythm. Peripheral pulses 2+ bilaterally.   EXTREMITIES: Without clubbing, cyanosis, or edema.  NEUROLOGIC: Steady, even gait.  PSYCH/MENTAL STATUS: Alert, oriented x 3. Cooperative, appropriate mood and affect.   EKG RESULT: EKG tracing is personally reviewed.   EKG: sinus rhythm.    No results found for any visits on 05/26/23.    Assessment & Plan:  Assessment and Plan    Hypertension Newly diagnosed with consistent elevated readings (149/105 and 146/98). No history of hypertension. Intermittent headaches reported. -Start Losartan 50mg  PO daily. -Advise low sodium diet. -Patient to monitor blood pressure at home and record readings. -Follow up in 2-3 weeks for blood pressure recheck.  Colon Cancer Screening Patient due for screening. -Order Cologuard test. -Provide patient with instructions for test completion.  Hyperlipidemia History of elevated cholesterol. -Plan fasting lipid panel at follow-up visit in 2-3 weeks.        Meds ordered this encounter  Medications   losartan (COZAAR) 50 MG tablet    Sig: Take 1 tablet (50 mg total) by mouth daily.    Dispense:  30 tablet    Refill:  0    Supervising Provider:   Garnette Gunner [[id number]]   Orders Placed This Encounter  Procedures   Cologuard   CBC with Differential/Platelet   Comprehensive metabolic panel   TSH   EKG 12-Lead   Lab Orders         Cologuard         CBC with Differential/Platelet         Comprehensive metabolic panel         TSH     No images are attached to the encounter or orders placed in the encounter.  Return in about 2 weeks (around 06/09/2023) for Blood Pressure re-check with fasting labs.   Salvatore Decent, FNP

## 2023-05-27 ENCOUNTER — Encounter: Payer: Self-pay | Admitting: Internal Medicine

## 2023-06-16 ENCOUNTER — Encounter: Payer: Self-pay | Admitting: Family Medicine

## 2023-06-16 ENCOUNTER — Ambulatory Visit (INDEPENDENT_AMBULATORY_CARE_PROVIDER_SITE_OTHER): Payer: Managed Care, Other (non HMO) | Admitting: Family Medicine

## 2023-06-16 ENCOUNTER — Ambulatory Visit: Payer: Managed Care, Other (non HMO) | Admitting: Internal Medicine

## 2023-06-16 VITALS — BP 144/88 | HR 98 | Ht 66.0 in | Wt 188.0 lb

## 2023-06-16 DIAGNOSIS — K5901 Slow transit constipation: Secondary | ICD-10-CM | POA: Insufficient documentation

## 2023-06-16 DIAGNOSIS — I10 Essential (primary) hypertension: Secondary | ICD-10-CM

## 2023-06-16 MED ORDER — CHLORTHALIDONE 25 MG PO TABS
25.0000 mg | ORAL_TABLET | Freq: Every day | ORAL | 0 refills | Status: DC
Start: 2023-06-16 — End: 2023-09-12

## 2023-06-16 NOTE — Progress Notes (Signed)
 Established Patient Office Visit   Subjective:  Patient ID: Tina Swanson, female    DOB: 28-May-1975  Age: 48 y.o. MRN: [id number]  Chief Complaint  Patient presents with   Hypertension    Recheck blood pressure. Pt was without her blood pressure medication for a few days while she was out of town. Pt complains of constipation due to the medication.     Hypertension Pertinent negatives include no blurred vision.   Encounter Diagnoses  Name Primary?   Primary hypertension Yes   Slow transit constipation    For follow-up of above.  Believes that losartan might have caused constipation.  She has not taken the losartan in a week and her constipation is about the same.  No regular exercise.  She does not smoke and rarely drinks alcohol.   Review of Systems  Constitutional: Negative.   HENT: Negative.    Eyes:  Negative for blurred vision, discharge and redness.  Respiratory: Negative.    Cardiovascular: Negative.   Gastrointestinal:  Positive for constipation. Negative for abdominal pain.  Genitourinary: Negative.   Musculoskeletal: Negative.  Negative for myalgias.  Skin:  Negative for rash.  Neurological:  Negative for tingling, loss of consciousness and weakness.  Endo/Heme/Allergies:  Negative for polydipsia.     Current Outpatient Medications:    chlorthalidone (HYGROTON) 25 MG tablet, Take 1 tablet (25 mg total) by mouth daily., Disp: 90 tablet, Rfl: 0   EPINEPHrine 0.3 mg/0.3 mL IJ SOAJ injection, Inject 0.3 mg into the muscle as needed for anaphylaxis., Disp: 1 each, Rfl: 0   Multiple Vitamins-Minerals (MULTIVITAMIN WITH MINERALS) tablet, Take 1 tablet by mouth daily., Disp: , Rfl:    benzonatate (TESSALON) 100 MG capsule, Take 1 capsule (100 mg total) by mouth every 8 (eight) hours as needed for cough. (Patient not taking: Reported on 05/26/2023), Disp: 21 capsule, Rfl: 0   fluticasone (FLONASE) 50 MCG/ACT nasal spray, Place 1 spray into both nostrils daily for 3  days., Disp: 16 g, Rfl: 0   phenol (CHLORASEPTIC) 1.4 % LIQD, Use as directed 1 spray in the mouth or throat as needed for throat irritation / pain. (Patient not taking: Reported on 05/26/2023), Disp: 118 mL, Rfl: 0   SUMAtriptan (IMITREX) 25 MG tablet, TAKE 1 TAB BY MOUTH EVERY 2 HOURS AS NEEDED FOR MIGRAINE. MAY REPEAT IN 2 HOURS IF HEADACHE PERSISTS OR RECURS (Patient not taking: Reported on 05/26/2023), Disp: 9 tablet, Rfl: 1   Objective:     BP (!) 144/88   Pulse 98   Ht 5\' 6"  (1.676 m)   Wt 188 lb (85.3 kg)   LMP 05/04/2023   SpO2 100%   BMI 30.34 kg/m    Physical Exam Constitutional:      General: She is not in acute distress.    Appearance: Normal appearance. She is not ill-appearing, toxic-appearing or diaphoretic.  HENT:     Head: Normocephalic and atraumatic.     Right Ear: External ear normal.     Left Ear: External ear normal.  Eyes:     General: No scleral icterus.       Right eye: No discharge.        Left eye: No discharge.     Extraocular Movements: Extraocular movements intact.     Conjunctiva/sclera: Conjunctivae normal.  Cardiovascular:     Rate and Rhythm: Normal rate and regular rhythm.  Pulmonary:     Effort: Pulmonary effort is normal. No respiratory distress.  Breath sounds: No wheezing, rhonchi or rales.  Skin:    General: Skin is warm and dry.  Neurological:     Mental Status: She is alert and oriented to person, place, and time.  Psychiatric:        Mood and Affect: Mood normal.        Behavior: Behavior normal.      No results found for any visits on 06/16/23.    The 10-year ASCVD risk score (Arnett DK, et al., 2019) is: 5.2%    Assessment & Plan:   Primary hypertension -     Chlorthalidone; Take 1 tablet (25 mg total) by mouth daily.  Dispense: 90 tablet; Refill: 0  Slow transit constipation    Return Schedule follow-up with Claris Gower in 4 to 6 weeks, for chronic disease follow-up.  Discontinued losartan.  Start  chlorthalidone 25 mg daily.  Warned of common side effect of urinary frequency at least initially.  Discussed limiting sodium.  Information given on managing hypertension and chlorthalidone.  Information given on constipation.  Advised increased exercise increased fiber in the diet.  Discussed using FiberCon or MiraLAX.  Mliss Sax, MD

## 2023-06-17 ENCOUNTER — Ambulatory Visit: Payer: Managed Care, Other (non HMO) | Admitting: Nurse Practitioner

## 2023-07-16 ENCOUNTER — Telehealth: Payer: Self-pay

## 2023-07-16 ENCOUNTER — Encounter: Payer: Self-pay | Admitting: Nurse Practitioner

## 2023-07-16 ENCOUNTER — Ambulatory Visit (INDEPENDENT_AMBULATORY_CARE_PROVIDER_SITE_OTHER): Payer: Managed Care, Other (non HMO) | Admitting: Nurse Practitioner

## 2023-07-16 VITALS — BP 128/80 | HR 75 | Temp 98.0°F | Ht 65.5 in | Wt 180.2 lb

## 2023-07-16 DIAGNOSIS — E7800 Pure hypercholesterolemia, unspecified: Secondary | ICD-10-CM

## 2023-07-16 DIAGNOSIS — T502X5A Adverse effect of carbonic-anhydrase inhibitors, benzothiadiazides and other diuretics, initial encounter: Secondary | ICD-10-CM | POA: Diagnosis not present

## 2023-07-16 DIAGNOSIS — E876 Hypokalemia: Secondary | ICD-10-CM | POA: Diagnosis not present

## 2023-07-16 DIAGNOSIS — I10 Essential (primary) hypertension: Secondary | ICD-10-CM

## 2023-07-16 LAB — RENAL FUNCTION PANEL
Albumin: 4.2 g/dL (ref 3.5–5.2)
BUN: 11 mg/dL (ref 6–23)
CO2: 32 meq/L (ref 19–32)
Calcium: 9.5 mg/dL (ref 8.4–10.5)
Chloride: 97 meq/L (ref 96–112)
Creatinine, Ser: 0.79 mg/dL (ref 0.40–1.20)
GFR: 88.39 mL/min (ref >60.00)
Glucose, Bld: 96 mg/dL (ref 70–99)
Phosphorus: 3.2 mg/dL (ref 2.3–4.6)
Potassium: 3.2 meq/L — ABNORMAL LOW (ref 3.5–5.1)
Sodium: 137 meq/L (ref 135–145)

## 2023-07-16 LAB — LIPID PANEL
Cholesterol: 199 mg/dL (ref 0–200)
HDL: 58.5 mg/dL (ref >39.00)
LDL Cholesterol: 131 mg/dL — ABNORMAL HIGH (ref 0–99)
NonHDL: 140.4
Total CHOL/HDL Ratio: 3
Triglycerides: 46 mg/dL (ref 0.0–149.0)
VLDL: 9.2 mg/dL (ref 0.0–40.0)

## 2023-07-16 NOTE — Patient Instructions (Signed)
 Go to lab Maintain Heart healthy diet and daily exercise. Maintain current medications. Complete cologuard Schedule appointment with GYN for repeat PAP smear  Mediterranean Diet A Mediterranean diet is based on the traditions of countries on the [place]. It focuses on eating more: Fruits and vegetables. Whole grains, beans, nuts, and seeds. Heart-healthy fats. These are fats that are good for your heart. It involves eating less: Dairy. Meat and eggs. Processed foods with added sugar, salt, and fat. This type of diet can help prevent certain conditions. It can also improve outcomes if you have a long-term (chronic) disease, such as kidney or heart disease. What are tips for following this plan? Reading food labels Check packaged foods for: The serving size. For foods such as rice and pasta, the serving size is the amount of cooked product, not dry. The total fat. Avoid foods with saturated fat or trans fat. Added sugars, such as corn syrup. Shopping  Try to have a balanced diet. Buy a variety of foods, such as: Fresh fruits and vegetables. You may be able to get these from local farmers markets. You can also buy them frozen. Grains, beans, nuts, and seeds. Some of these can be bought in bulk. Fresh seafood. Poultry and eggs. Low-fat dairy products. Buy whole ingredients instead of foods that have already been packaged. If you can't get fresh seafood, buy precooked frozen shrimp or canned fish, such as tuna, salmon, or sardines. Stock your pantry so you always have certain foods on hand, such as olive oil, canned tuna, canned tomatoes, rice, pasta, and beans. Cooking Cook foods with extra-virgin olive oil instead of using butter or other vegetable oils. Have meat as a side dish. Have vegetables or grains as your main dish. This means having meat in small portions or adding small amounts of meat to foods like pasta or stew. Use beans or vegetables instead of meat in common  dishes like chili or lasagna. Try out different cooking methods. Try roasting, broiling, steaming, and sauting vegetables. Add frozen vegetables to soups, stews, pasta, or rice. Add nuts or seeds for added healthy fats and plant protein at each meal. You can add these to yogurt, salads, or vegetable dishes. Marinate fish or vegetables using olive oil, lemon juice, garlic, and fresh herbs. Meal planning Plan to eat a vegetarian meal one day each week. Try to work up to two vegetarian meals, if possible. Eat seafood two or more times a week. Have healthy snacks on hand. These may include: Vegetable sticks with hummus. Greek yogurt. Fruit and nut trail mix. Eat balanced meals. These should include: Fruit: 2-3 servings a day. Vegetables: 4-5 servings a day. Low-fat dairy: 2 servings a day. Fish, poultry, or lean meat: 1 serving a day. Beans and legumes: 2 or more servings a week. Nuts and seeds: 1-2 servings a day. Whole grains: 6-8 servings a day. Extra-virgin olive oil: 3-4 servings a day. Limit red meat and sweets to just a few servings a month. Lifestyle  Try to cook and eat meals with your family. Drink enough fluid to keep your pee (urine) pale yellow. Be active every day. This includes: Aerobic exercise, which is exercise that causes your heart to beat faster. Examples include running and swimming. Leisure activities like gardening, walking, or housework. Get 7-8 hours of sleep each night. Drink red wine if your provider says you can. A glass of wine is 5 oz (150 mL). You may be allowed to have: Up to 1 glass a day  if you're female and not pregnant. Up to 2 glasses a day if you're female. What foods should I eat? Fruits Apples. Apricots. Avocado. Berries. Bananas. Cherries. Dates. Figs. Grapes. Lemons. Melon. Oranges. Peaches. Plums. Pomegranate. Vegetables Artichokes. Beets. Broccoli. Cabbage. Carrots. Eggplant. Green beans. Chard. Kale. Spinach. Onions. Leeks. Peas. Squash.  Tomatoes. Peppers. Radishes. Grains Whole-grain pasta. Brown rice. Bulgur wheat. Polenta. Couscous. Whole-wheat bread. [place] Cobb. Meats and other proteins Beans. Almonds. Sunflower seeds. Pine nuts. Peanuts. Cod. Salmon. Scallops. Shrimp. Tuna. Tilapia. Clams. Oysters. Eggs. Chicken or [place] without skin. Dairy Low-fat milk. Cheese. Greek yogurt. Fats and oils Extra-virgin olive oil. Avocado oil. Grapeseed oil. Beverages Water. Red wine. Herbal tea. Sweets and desserts Greek yogurt with honey. Baked apples. Poached pears. Trail mix. Seasonings and condiments Basil. Cilantro. Coriander. Cumin. Mint. Parsley. Sage. Rosemary. Tarragon. Garlic. Oregano. Thyme. Pepper. Balsamic vinegar. Tahini. Hummus. Tomato sauce. Olives. Mushrooms. The items listed above may not be all the foods and drinks you can have. Talk to a dietitian to learn more. What foods should I limit? This is a list of foods that should be eaten rarely. Fruits Fruit canned in syrup. Vegetables Deep-fried potatoes, like [place] fries. Grains Packaged pasta or rice dishes. Cereal with added sugar. Snacks with added sugar. Meats and other proteins Beef. Pork. Lamb. Chicken or [place] with skin. Hot dogs. Tomasa Blase. Dairy Ice cream. Sour cream. Whole milk. Fats and oils Butter. Canola oil. Vegetable oil. Beef fat (tallow). Lard. Beverages Juice. Sugar-sweetened soft drinks. Beer. Liquor and spirits. Sweets and desserts Cookies. Cakes. Pies. Candy. Seasonings and condiments Mayonnaise. Pre-made sauces and marinades. The items listed above may not be all the foods and drinks you should limit. Talk to a dietitian to learn more. Where to find more information American Heart Association (AHA): heart.org This information is not intended to replace advice given to you by your health care provider. Make sure you discuss any questions you have with your health care provider. Document Revised: 07/28/2022 Document Reviewed:  07/28/2022 Elsevier Patient Education  2024 [employer].

## 2023-07-16 NOTE — Assessment & Plan Note (Signed)
 Repeat lipid panel Advised to maintain mediterranean diet

## 2023-07-16 NOTE — Progress Notes (Signed)
 Established Patient Visit  Patient: Tina Swanson   DOB: [birth year]   48 y.o. Female  MRN: [id number] Visit Date: 07/16/2023  Subjective:    Chief Complaint  Patient presents with   Hypertension    Taking BP meds as prescribed. Changed seasonings.Cut back on processed foods.    Primary hypertension BP at goal with chlorthalidone Denies any adverse effects Denies any signs of OBSTRUCTIVE SLEEP APNEA, admits to high sodium diet and no exercise. No tobacco use. Stopped ALCOHOL use 3months ago  BP Readings from Last 3 Encounters:  07/16/23 128/80  06/16/23 (!) 144/88  05/26/23 (!) 146/98    Repeat BMP Maintain med dose  Advised to maintain DASH diet F/up in 3months  Pure hypercholesterolemia Repeat lipid panel Advised to maintain mediterranean diet  Reviewed medical, surgical, and social history today  Medications: Outpatient Medications Prior to Visit  Medication Sig   chlorthalidone (HYGROTON) 25 MG tablet Take 1 tablet (25 mg total) by mouth daily.   EPINEPHrine 0.3 mg/0.3 mL IJ SOAJ injection Inject 0.3 mg into the muscle as needed for anaphylaxis.   fluticasone (FLONASE) 50 MCG/ACT nasal spray Place 1 spray into both nostrils daily for 3 days.   [DISCONTINUED] benzonatate (TESSALON) 100 MG capsule Take 1 capsule (100 mg total) by mouth every 8 (eight) hours as needed for cough. (Patient not taking: Reported on 07/16/2023)   [DISCONTINUED] Multiple Vitamins-Minerals (MULTIVITAMIN WITH MINERALS) tablet Take 1 tablet by mouth daily. (Patient not taking: Reported on 07/16/2023)   [DISCONTINUED] phenol (CHLORASEPTIC) 1.4 % LIQD Use as directed 1 spray in the mouth or throat as needed for throat irritation / pain. (Patient not taking: Reported on 07/16/2023)   [DISCONTINUED] SUMAtriptan (IMITREX) 25 MG tablet TAKE 1 TAB BY MOUTH EVERY 2 HOURS AS NEEDED FOR MIGRAINE. MAY REPEAT IN 2 HOURS IF HEADACHE PERSISTS OR RECURS (Patient not taking: Reported on 07/16/2023)    No facility-administered medications prior to visit.   Reviewed past medical and social history.   ROS per HPI above      Objective:  BP 128/80 (BP Location: Right Arm, Patient Position: Sitting)   Pulse 75   Temp 98 F (36.7 C)   Ht 5' 5.5" (1.664 m)   Wt 180 lb 3.2 oz (81.7 kg)   LMP 07/12/2023 (Exact Date)   BMI 29.53 kg/m      Physical Exam Vitals and nursing note reviewed.  Cardiovascular:     Rate and Rhythm: Normal rate and regular rhythm.     Pulses: Normal pulses.     Heart sounds: Normal heart sounds.  Pulmonary:     Effort: Pulmonary effort is normal.     Breath sounds: Normal breath sounds.  Musculoskeletal:     Right lower leg: No edema.     Left lower leg: No edema.  Neurological:     Mental Status: She is alert and oriented to person, place, and time.     No results found for any visits on 07/16/23.    Assessment & Plan:    Problem List Items Addressed This Visit     Primary hypertension - Primary   BP at goal with chlorthalidone Denies any adverse effects Denies any signs of OBSTRUCTIVE SLEEP APNEA, admits to high sodium diet and no exercise. No tobacco use. Stopped ALCOHOL use 3months ago  BP Readings from Last 3 Encounters:  07/16/23 128/80  06/16/23 (!) 144/88  05/26/23 Marland Kitchen)  146/98    Repeat BMP Maintain med dose  Advised to maintain DASH diet F/up in 3months      Relevant Orders   Renal Function Panel   Pure hypercholesterolemia   Repeat lipid panel Advised to maintain mediterranean diet      Relevant Orders   Lipid panel   Return in about 3 months (around 10/16/2023) for HTN.     Alysia Penna, NP

## 2023-07-16 NOTE — Assessment & Plan Note (Addendum)
 BP at goal with chlorthalidone Denies any adverse effects Denies any signs of OBSTRUCTIVE SLEEP APNEA, admits to high sodium diet and no exercise. No tobacco use. Stopped ALCOHOL use 3months ago  BP Readings from Last 3 Encounters:  07/16/23 128/80  06/16/23 (!) 144/88  05/26/23 (!) 146/98    Repeat BMP Maintain med dose  Advised to maintain DASH diet F/up in 3months

## 2023-07-17 ENCOUNTER — Encounter: Payer: Self-pay | Admitting: Nurse Practitioner

## 2023-07-17 MED ORDER — POTASSIUM CHLORIDE CRYS ER 20 MEQ PO TBCR
20.0000 meq | EXTENDED_RELEASE_TABLET | Freq: Every day | ORAL | 1 refills | Status: DC
Start: 2023-07-17 — End: 2023-10-22

## 2023-07-17 NOTE — Addendum Note (Signed)
 Addended by: Michaela Corner on: 07/17/2023 09:22 PM   Modules accepted: Orders

## 2023-07-29 NOTE — Telephone Encounter (Signed)
 Called Tina Swanson to check the status of Cervical Cancer Screening/PAP results being sent. Receptionist states they were sent on 07/23/23. Records not received, not in patient chart. Provided correct fax number and asked that the records be resent.

## 2023-08-21 NOTE — Telephone Encounter (Signed)
 The results received on 07/29/23 were STD results, not PAP smear results. Paper fax request for med records for PAP results sent.

## 2023-09-11 ENCOUNTER — Other Ambulatory Visit: Payer: Self-pay | Admitting: Family Medicine

## 2023-09-11 DIAGNOSIS — I10 Essential (primary) hypertension: Secondary | ICD-10-CM

## 2023-10-22 ENCOUNTER — Encounter: Payer: Self-pay | Admitting: Nurse Practitioner

## 2023-10-22 ENCOUNTER — Ambulatory Visit: Admitting: Nurse Practitioner

## 2023-10-22 ENCOUNTER — Ambulatory Visit: Payer: Self-pay | Admitting: Nurse Practitioner

## 2023-10-22 VITALS — BP 132/80 | HR 78 | Temp 97.8°F | Ht 65.0 in | Wt 167.4 lb

## 2023-10-22 DIAGNOSIS — E7800 Pure hypercholesterolemia, unspecified: Secondary | ICD-10-CM | POA: Diagnosis not present

## 2023-10-22 DIAGNOSIS — I10 Essential (primary) hypertension: Secondary | ICD-10-CM | POA: Diagnosis not present

## 2023-10-22 DIAGNOSIS — E876 Hypokalemia: Secondary | ICD-10-CM

## 2023-10-22 LAB — LIPID PANEL
Cholesterol: 219 mg/dL — ABNORMAL HIGH (ref 0–200)
HDL: 44.2 mg/dL (ref >39.00)
LDL Cholesterol: 161 mg/dL — ABNORMAL HIGH (ref 0–99)
NonHDL: 175.17
Total CHOL/HDL Ratio: 5
Triglycerides: 72 mg/dL (ref 0.0–149.0)
VLDL: 14.4 mg/dL (ref 0.0–40.0)

## 2023-10-22 LAB — BASIC METABOLIC PANEL WITH GFR
BUN: 9 mg/dL (ref 6–23)
CO2: 33 meq/L — ABNORMAL HIGH (ref 19–32)
Calcium: 9.1 mg/dL (ref 8.4–10.5)
Chloride: 99 meq/L (ref 96–112)
Creatinine, Ser: 0.73 mg/dL (ref 0.40–1.20)
GFR: 96.99 mL/min (ref >60.00)
Glucose, Bld: 95 mg/dL (ref 70–99)
Potassium: 2.7 meq/L — CL (ref 3.5–5.1)
Sodium: 139 meq/L (ref 135–145)

## 2023-10-22 MED ORDER — CHLORTHALIDONE 25 MG PO TABS
25.0000 mg | ORAL_TABLET | Freq: Every day | ORAL | 0 refills | Status: AC
Start: 2023-10-22 — End: ?

## 2023-10-22 MED ORDER — POTASSIUM CHLORIDE CRYS ER 20 MEQ PO TBCR: 20.0000 meq | EXTENDED_RELEASE_TABLET | Freq: Two times a day (BID) | ORAL | 0 refills | Status: DC

## 2023-10-22 NOTE — Progress Notes (Signed)
                Established Patient Visit  Patient: Tina Swanson   DOB: [birth year]   48 y.o. Female  MRN: [id number] Visit Date: 10/22/2023  Subjective:   No chief complaint on file.  HPI Primary hypertension Not consistent with med dose, takes med 3x/week Has maintained a low salt diet. No consistent exercise. No BP check at home BP Readings from Last 3 Encounters:  10/22/23 132/80  07/16/23 128/80  06/16/23 (!) 144/88   Advised to take med as prescribed, monitor BP at home, send BP readings via mychart in 2weeks via mychart Check BMP F//up in 3months  Pure hypercholesterolemia Repeat lipid panel  Reviewed medical, surgical, and social history today  Medications: Outpatient Medications Prior to Visit  Medication Sig   diphenhydrAMINE (BENADRYL ALLERGY) 25 mg capsule Take 25 mg by mouth as needed for itching or allergies.   EPINEPHrine  0.3 mg/0.3 mL IJ SOAJ injection Inject 0.3 mg into the muscle as needed for anaphylaxis.   fluticasone  (FLONASE ) 50 MCG/ACT nasal spray Place 1 spray into both nostrils daily for 3 days. (Patient taking differently: Place 1 spray into both nostrils as needed for allergies or rhinitis.)   [DISCONTINUED] chlorthalidone  (HYGROTON ) 25 MG tablet TAKE 1 TABLET (25 MG TOTAL) BY MOUTH DAILY.   [DISCONTINUED] potassium chloride  SA (KLOR-CON  M) 20 MEQ tablet Take 1 tablet (20 mEq total) by mouth daily. (Patient not taking: Reported on 10/22/2023)   No facility-administered medications prior to visit.   Reviewed past medical and social history.   ROS per HPI above      Objective:  BP 132/80 (BP Location: Left Arm, Patient Position: Sitting, Cuff Size: Large)   Pulse 78   Temp 97.8 F (36.6 C) (Oral)   Ht 5' 5 (1.651 m)   Wt 167 lb 6.4 oz (75.9 kg)   BMI 27.86 kg/m      Physical Exam Vitals and nursing note reviewed.   Cardiovascular:     Rate and Rhythm: Normal rate and regular rhythm.     Pulses: Normal pulses.     Heart  sounds: Normal heart sounds.  Pulmonary:     Effort: Pulmonary effort is normal.     Breath sounds: Normal breath sounds.   Neurological:     Mental Status: She is alert and oriented to person, place, and time.     No results found for any visits on 10/22/23.    Assessment & Plan:    Problem List Items Addressed This Visit     Primary hypertension - Primary   Not consistent with med dose, takes med 3x/week Has maintained a low salt diet. No consistent exercise. No BP check at home BP Readings from Last 3 Encounters:  10/22/23 132/80  07/16/23 128/80  06/16/23 (!) 144/88   Advised to take med as prescribed, monitor BP at home, send BP readings via mychart in 2weeks via mychart Check BMP F//up in 3months      Relevant Medications   chlorthalidone  (HYGROTON ) 25 MG tablet   Other Relevant Orders   Basic metabolic panel with GFR   Pure hypercholesterolemia   Repeat lipid panel      Relevant Medications   chlorthalidone  (HYGROTON ) 25 MG tablet   Other Relevant Orders   Lipid panel   Return in about 3 months (around 01/22/2024) for CPE (fasting).     Roselie Mood, NP

## 2023-10-22 NOTE — Assessment & Plan Note (Signed)
 Repeat lipid panel ?

## 2023-10-22 NOTE — Assessment & Plan Note (Signed)
 Not consistent with med dose, takes med 3x/week Has maintained a low salt diet. No consistent exercise. No BP check at home BP Readings from Last 3 Encounters:  10/22/23 132/80  07/16/23 128/80  06/16/23 (!) 144/88   Advised to take med as prescribed, monitor BP at home, send BP readings via mychart in 2weeks via mychart Check BMP F//up in 3months

## 2023-10-22 NOTE — Patient Instructions (Signed)
 Go to lab Maintain Heart healthy diet and daily exercise. Maintain current medications. Send BP readings via mychart in 2weeks  Mediterranean Diet A Mediterranean diet is based on the traditions of countries on the [place]. It focuses on eating more: Fruits and vegetables. Whole grains, beans, nuts, and seeds. Heart-healthy fats. These are fats that are good for your heart. It involves eating less: Dairy. Meat and eggs. Processed foods with added sugar, salt, and fat. This type of diet can help prevent certain conditions. It can also improve outcomes if you have a long-term (chronic) disease, such as kidney or heart disease. What are tips for following this plan? Reading food labels Check packaged foods for: The serving size. For foods such as rice and pasta, the serving size is the amount of cooked product, not dry. The total fat. Avoid foods with saturated fat or trans fat. Added sugars, such as corn syrup. Shopping  Try to have a balanced diet. Buy a variety of foods, such as: Fresh fruits and vegetables. You may be able to get these from local farmers markets. You can also buy them frozen. Grains, beans, nuts, and seeds. Some of these can be bought in bulk. Fresh seafood. Poultry and eggs. Low-fat dairy products. Buy whole ingredients instead of foods that have already been packaged. If you can't get fresh seafood, buy precooked frozen shrimp or canned fish, such as tuna, salmon, or sardines. Stock your pantry so you always have certain foods on hand, such as olive oil, canned tuna, canned tomatoes, rice, pasta, and beans. Cooking Cook foods with extra-virgin olive oil instead of using butter or other vegetable oils. Have meat as a side dish. Have vegetables or grains as your main dish. This means having meat in small portions or adding small amounts of meat to foods like pasta or stew. Use beans or vegetables instead of meat in common dishes like chili or  lasagna. Try out different cooking methods. Try roasting, broiling, steaming, and sauting vegetables. Add frozen vegetables to soups, stews, pasta, or rice. Add nuts or seeds for added healthy fats and plant protein at each meal. You can add these to yogurt, salads, or vegetable dishes. Marinate fish or vegetables using olive oil, lemon juice, garlic, and fresh herbs. Meal planning Plan to eat a vegetarian meal one day each week. Try to work up to two vegetarian meals, if possible. Eat seafood two or more times a week. Have healthy snacks on hand. These may include: Vegetable sticks with hummus. Greek yogurt. Fruit and nut trail mix. Eat balanced meals. These should include: Fruit: 2-3 servings a day. Vegetables: 4-5 servings a day. Low-fat dairy: 2 servings a day. Fish, poultry, or lean meat: 1 serving a day. Beans and legumes: 2 or more servings a week. Nuts and seeds: 1-2 servings a day. Whole grains: 6-8 servings a day. Extra-virgin olive oil: 3-4 servings a day. Limit red meat and sweets to just a few servings a month. Lifestyle  Try to cook and eat meals with your family. Drink enough fluid to keep your pee (urine) pale yellow. Be active every day. This includes: Aerobic exercise, which is exercise that causes your heart to beat faster. Examples include running and swimming. Leisure activities like gardening, walking, or housework. Get 7-8 hours of sleep each night. Drink red wine if your provider says you can. A glass of wine is 5 oz (150 mL). You may be allowed to have: Up to 1 glass a day if you're female  and not pregnant. Up to 2 glasses a day if you're female. What foods should I eat? Fruits Apples. Apricots. Avocado. Berries. Bananas. Cherries. Dates. Figs. Grapes. Lemons. Melon. Oranges. Peaches. Plums. Pomegranate. Vegetables Artichokes. Beets. Broccoli. Cabbage. Carrots. Eggplant. Green beans. Chard. Kale. Spinach. Onions. Leeks. Peas. Squash. Tomatoes. Peppers.  Radishes. Grains Whole-grain pasta. Brown rice. Bulgur wheat. Polenta. Couscous. Whole-wheat bread. [place] Madeira. Meats and other proteins Beans. Almonds. Sunflower seeds. Pine nuts. Peanuts. Cod. Salmon. Scallops. Shrimp. Tuna. Tilapia. Clams. Oysters. Eggs. Chicken or [place] without skin. Dairy Low-fat milk. Cheese. Greek yogurt. Fats and oils Extra-virgin olive oil. Avocado oil. Grapeseed oil. Beverages Water. Red wine. Herbal tea. Sweets and desserts Greek yogurt with honey. Baked apples. Poached pears. Trail mix. Seasonings and condiments Basil. Cilantro. Coriander. Cumin. Mint. Parsley. Sage. Rosemary. Tarragon. Garlic. Oregano. Thyme. Pepper. Balsamic vinegar. Tahini. Hummus. Tomato sauce. Olives. Mushrooms. The items listed above may not be all the foods and drinks you can have. Talk to a dietitian to learn more. What foods should I limit? This is a list of foods that should be eaten rarely. Fruits Fruit canned in syrup. Vegetables Deep-fried potatoes, like [place] fries. Grains Packaged pasta or rice dishes. Cereal with added sugar. Snacks with added sugar. Meats and other proteins Beef. Pork. Lamb. Chicken or [place] with skin. Hot dogs. [place]. Dairy Ice cream. Sour cream. Whole milk. Fats and oils Butter. Canola oil. Vegetable oil. Beef fat (tallow). Lard. Beverages Juice. Sugar-sweetened soft drinks. Beer. Liquor and spirits. Sweets and desserts Cookies. Cakes. Pies. Candy. Seasonings and condiments Mayonnaise. Pre-made sauces and marinades. The items listed above may not be all the foods and drinks you should limit. Talk to a dietitian to learn more. Where to find more information American Heart Association (AHA): heart.org This information is not intended to replace advice given to you by your health care provider. Make sure you discuss any questions you have with your health care provider. Document Revised: 07/28/2022 Document Reviewed: 07/28/2022 Elsevier  Patient Education  2024 [employer].

## 2023-10-23 MED ORDER — AMLODIPINE BESYLATE 5 MG PO TABS: 5.0000 mg | ORAL_TABLET | Freq: Every evening | ORAL | 1 refills | Status: AC

## 2023-10-23 MED ORDER — AMLODIPINE BESYLATE 5 MG PO TABS: 5.0000 mg | ORAL_TABLET | Freq: Every day | ORAL | 1 refills | Status: DC

## 2023-10-23 NOTE — Telephone Encounter (Signed)
 CRITICAL VALUE STICKER  CRITICAL VALUE: K 2.7  RECEIVER (on-site recipient of call): Angel  DATE & TIME NOTIFIED: 10/22/23 1631  MESSENGER (representative from lab): unknown  MD NOTIFIED: Roselie Mood, NP  TIME OF NOTIFICATION: unknown  RESPONSE: Provider notified and pt sent [facility] message with instructions.

## 2023-10-23 NOTE — Telephone Encounter (Signed)
 Pt has not read MyChart message. Called to give her results and instructions. Lab appt scheduled for repeat potassium. Pt expresses understanding.

## 2023-10-23 NOTE — Telephone Encounter (Signed)
 I was out of the office on 10/22/23 afternoon. The clinic cell phone was given to a CMA in the office, who reported to me on 10/23/23 morning that the clinic cell phone rang about 3 times the previous afternoon. I asked if anyone took the call, it was likely a critical result being called or did [facility] call the front office.   I was informed that [facility] called the [facility] with the result, taken by Clayborne Lever. Angel notified Roselie Mood, NP.

## 2023-10-23 NOTE — Assessment & Plan Note (Addendum)
 D/c chlorthalidone  due to persistent severe hypokalemia. Start amlodipine 5mg  in PM F/up in 1-86month

## 2023-11-05 ENCOUNTER — Other Ambulatory Visit

## 2023-11-05 DIAGNOSIS — T502X5A Adverse effect of carbonic-anhydrase inhibitors, benzothiadiazides and other diuretics, initial encounter: Secondary | ICD-10-CM | POA: Diagnosis not present

## 2023-11-05 DIAGNOSIS — E876 Hypokalemia: Secondary | ICD-10-CM | POA: Diagnosis not present

## 2023-11-06 LAB — POTASSIUM: Potassium: 3.1 meq/L — ABNORMAL LOW (ref 3.5–5.1)

## 2023-11-07 ENCOUNTER — Ambulatory Visit: Payer: Self-pay | Admitting: Nurse Practitioner

## 2023-11-07 DIAGNOSIS — E876 Hypokalemia: Secondary | ICD-10-CM

## 2023-11-07 MED ORDER — POTASSIUM CHLORIDE CRYS ER 20 MEQ PO TBCR: 20.0000 meq | EXTENDED_RELEASE_TABLET | Freq: Every day | ORAL | 1 refills | Status: AC

## 2024-01-28 ENCOUNTER — Encounter: Admitting: Nurse Practitioner

## 2024-03-04 ENCOUNTER — Other Ambulatory Visit: Payer: Self-pay | Admitting: Nurse Practitioner

## 2024-03-04 ENCOUNTER — Encounter: Payer: Self-pay | Admitting: Nurse Practitioner

## 2024-03-04 ENCOUNTER — Ambulatory Visit (INDEPENDENT_AMBULATORY_CARE_PROVIDER_SITE_OTHER): Admitting: Nurse Practitioner

## 2024-03-04 VITALS — BP 132/74 | Temp 97.8°F | Ht 65.0 in | Wt 182.8 lb

## 2024-03-04 DIAGNOSIS — E7800 Pure hypercholesterolemia, unspecified: Secondary | ICD-10-CM

## 2024-03-04 DIAGNOSIS — R7301 Impaired fasting glucose: Secondary | ICD-10-CM

## 2024-03-04 DIAGNOSIS — L300 Nummular dermatitis: Secondary | ICD-10-CM | POA: Insufficient documentation

## 2024-03-04 DIAGNOSIS — I10 Essential (primary) hypertension: Secondary | ICD-10-CM | POA: Diagnosis not present

## 2024-03-04 DIAGNOSIS — Z91018 Allergy to other foods: Secondary | ICD-10-CM | POA: Diagnosis not present

## 2024-03-04 DIAGNOSIS — Z0001 Encounter for general adult medical examination with abnormal findings: Secondary | ICD-10-CM | POA: Diagnosis not present

## 2024-03-04 LAB — CBC
HCT: 38 % (ref 36.0–46.0)
Hemoglobin: 12.3 g/dL (ref 12.0–15.0)
MCHC: 32.5 g/dL (ref 30.0–36.0)
MCV: 83.9 fl (ref 78.0–100.0)
Platelets: 296 K/uL (ref 150.0–400.0)
RBC: 4.53 Mil/uL (ref 3.87–5.11)
RDW: 13.2 % (ref 11.5–15.5)
WBC: 3.7 K/uL — ABNORMAL LOW (ref 4.0–10.5)

## 2024-03-04 LAB — COMPREHENSIVE METABOLIC PANEL WITH GFR
ALT: 16 U/L (ref 0–35)
AST: 20 U/L (ref 0–37)
Albumin: 4 g/dL (ref 3.5–5.2)
Alkaline Phosphatase: 57 U/L (ref 39–117)
BUN: 11 mg/dL (ref 6–23)
CO2: 28 meq/L (ref 19–32)
Calcium: 8.6 mg/dL (ref 8.4–10.5)
Chloride: 105 meq/L (ref 96–112)
Creatinine, Ser: 0.71 mg/dL (ref 0.40–1.20)
GFR: 100.02 mL/min (ref >60.00)
Glucose, Bld: 89 mg/dL (ref 70–99)
Potassium: 3.9 meq/L (ref 3.5–5.1)
Sodium: 139 meq/L (ref 135–145)
Total Bilirubin: 0.3 mg/dL (ref 0.2–1.2)
Total Protein: 7.2 g/dL (ref 6.0–8.3)

## 2024-03-04 LAB — HEMOGLOBIN A1C: Hgb A1c MFr Bld: 6 % (ref 4.6–6.5)

## 2024-03-04 MED ORDER — NEFFY 2 MG/0.1ML NA SOLN
1.0000 | Freq: Once | NASAL | 0 refills | Status: DC
Start: 2024-03-04 — End: 2024-03-05

## 2024-03-04 NOTE — Assessment & Plan Note (Signed)
 She requested to switch from  IM epi to nasal spray. Neffy  Prescription sent

## 2024-03-04 NOTE — Patient Instructions (Addendum)
 Go to lab Maintain Heart healthy diet and daily exercise. Maintain current medications.  Mediterranean Diet A Mediterranean diet is based on the traditions of countries on the [employer]. It focuses on eating more: Fruits and vegetables. Whole grains, beans, nuts, and seeds. Heart-healthy fats. These are fats that are good for your heart. It involves eating less: Dairy. Meat and eggs. Processed foods with added sugar, salt, and fat. This type of diet can help prevent certain conditions. It can also improve outcomes if you have a long-term (chronic) disease, such as kidney or heart disease. What are tips for following this plan? Reading food labels Check packaged foods for: The serving size. For foods such as rice and pasta, the serving size is the amount of cooked product, not dry. The total fat. Avoid foods with saturated fat or trans fat. Added sugars, such as corn syrup. Shopping  Try to have a balanced diet. Buy a variety of foods, such as: Fresh fruits and vegetables. You may be able to get these from local farmers markets. You can also buy them frozen. Grains, beans, nuts, and seeds. Some of these can be bought in bulk. Fresh seafood. Poultry and eggs. Low-fat dairy products. Buy whole ingredients instead of foods that have already been packaged. If you can't get fresh seafood, buy precooked frozen shrimp or canned fish, such as tuna, salmon, or sardines. Stock your pantry so you always have certain foods on hand, such as olive oil, canned tuna, canned tomatoes, rice, pasta, and beans. Cooking Cook foods with extra-virgin olive oil instead of using butter or other vegetable oils. Have meat as a side dish. Have vegetables or grains as your main dish. This means having meat in small portions or adding small amounts of meat to foods like pasta or stew. Use beans or vegetables instead of meat in common dishes like chili or lasagna. Try out different cooking methods. Try  roasting, broiling, steaming, and sauting vegetables. Add frozen vegetables to soups, stews, pasta, or rice. Add nuts or seeds for added healthy fats and plant protein at each meal. You can add these to yogurt, salads, or vegetable dishes. Marinate fish or vegetables using olive oil, lemon juice, garlic, and fresh herbs. Meal planning Plan to eat a vegetarian meal one day each week. Try to work up to two vegetarian meals, if possible. Eat seafood two or more times a week. Have healthy snacks on hand. These may include: Vegetable sticks with hummus. Greek yogurt. Fruit and nut trail mix. Eat balanced meals. These should include: Fruit: 2-3 servings a day. Vegetables: 4-5 servings a day. Low-fat dairy: 2 servings a day. Fish, poultry, or lean meat: 1 serving a day. Beans and legumes: 2 or more servings a week. Nuts and seeds: 1-2 servings a day. Whole grains: 6-8 servings a day. Extra-virgin olive oil: 3-4 servings a day. Limit red meat and sweets to just a few servings a month. Lifestyle  Try to cook and eat meals with your family. Drink enough fluid to keep your pee (urine) pale yellow. Be active every day. This includes: Aerobic exercise, which is exercise that causes your heart to beat faster. Examples include running and swimming. Leisure activities like gardening, walking, or housework. Get 7-8 hours of sleep each night. Drink red wine if your provider says you can. A glass of wine is 5 oz (150 mL). You may be allowed to have: Up to 1 glass a day if you're female and not pregnant. Up to 2 glasses  a day if you're female. What foods should I eat? Fruits Apples. Apricots. Avocado. Berries. Bananas. Cherries. Dates. Figs. Grapes. Lemons. Melon. Oranges. Peaches. Plums. Pomegranate. Vegetables Artichokes. Beets. Broccoli. Cabbage. Carrots. Eggplant. Green beans. Chard. Kale. Spinach. Onions. Leeks. Peas. Squash. Tomatoes. Peppers. Radishes. Grains Whole-grain pasta. Brown rice.  Bulgur wheat. Polenta. Couscous. Whole-wheat bread. Orpah Cobb. Meats and other proteins Beans. Almonds. Sunflower seeds. Pine nuts. Peanuts. Cod. Salmon. Scallops. Shrimp. Tuna. Tilapia. Clams. Oysters. Eggs. Chicken or [place] without skin. Dairy Low-fat milk. Cheese. Greek yogurt. Fats and oils Extra-virgin olive oil. Avocado oil. Grapeseed oil. Beverages Water. Red wine. Herbal tea. Sweets and desserts Greek yogurt with honey. Baked apples. Poached pears. Trail mix. Seasonings and condiments Basil. Cilantro. Coriander. Cumin. Mint. Parsley. Sage. Rosemary. Tarragon. Garlic. Oregano. Thyme. Pepper. Balsamic vinegar. Tahini. Hummus. Tomato sauce. Olives. Mushrooms. The items listed above may not be all the foods and drinks you can have. Talk to a dietitian to learn more. What foods should I limit? This is a list of foods that should be eaten rarely. Fruits Fruit canned in syrup. Vegetables Deep-fried potatoes, like [place] fries. Grains Packaged pasta or rice dishes. Cereal with added sugar. Snacks with added sugar. Meats and other proteins Beef. Pork. Lamb. Chicken or [place] with skin. Hot dogs. Tomasa Blase. Dairy Ice cream. Sour cream. Whole milk. Fats and oils Butter. Canola oil. Vegetable oil. Beef fat (tallow). Lard. Beverages Juice. Sugar-sweetened soft drinks. Beer. Liquor and spirits. Sweets and desserts Cookies. Cakes. Pies. Candy. Seasonings and condiments Mayonnaise. Pre-made sauces and marinades. The items listed above may not be all the foods and drinks you should limit. Talk to a dietitian to learn more. Where to find more information American Heart Association (AHA): heart.org This information is not intended to replace advice given to you by your health care provider. Make sure you discuss any questions you have with your health care provider. Document Revised: 07/28/2022 Document Reviewed: 07/28/2022 Elsevier Patient Education  2024 [employer].

## 2024-03-04 NOTE — Assessment & Plan Note (Signed)
 BP at goal with amlodipine  and chlorthialidone BP Readings from Last 3 Encounters:  03/04/24 132/74  10/22/23 132/80  07/16/23 128/80    Maintain med doses F/up in 6months

## 2024-03-04 NOTE — Progress Notes (Signed)
 Complete physical exam  Patient: Tina Swanson   DOB: Sep 16, 1975   48 y.o. Female  MRN: [id number] Visit Date: 03/04/2024  Subjective:    Chief Complaint  Patient presents with   Annual Exam    FASTING    Tina Swanson is a 48 y.o. female who presents today for a complete physical exam. She reports consuming a general diet. No consistent exercise regimen She generally feels well. She reports sleeping well. She does have additional problems to discuss today.  Vision:Yes Dental:No STD Screen:No She declined referral to GI for colonoscopy, but has not completed cologuard kit that was sent.  BP Readings from Last 3 Encounters:  03/04/24 132/74  10/22/23 132/80  07/16/23 128/80   Wt Readings from Last 3 Encounters:  03/04/24 182 lb 12.8 oz (82.9 kg)  10/22/23 167 lb 6.4 oz (75.9 kg)  07/16/23 180 lb 3.2 oz (81.7 kg)   Most recent fall risk assessment:    03/04/2024    1:06 PM  Fall Risk   Falls in the past year? 0  Number falls in past yr: 0  Injury with Fall? 0  Risk for fall due to : No Fall Risks  Follow up Falls evaluation completed    Depression screen:Yes - No Depression Most recent depression screenings:    03/04/2024    1:06 PM 07/16/2023    1:23 PM  PHQ 2/9 Scores  PHQ - 2 Score 2 2  PHQ- 9 Score 5 9      Data saved with a previous flowsheet row definition   HPI  Food allergy She requested to switch from  IM epi to nasal spray. Neffy  Prescription sent  Primary hypertension BP at goal with amlodipine  and chlorthialidone BP Readings from Last 3 Encounters:  03/04/24 132/74  10/22/23 132/80  07/16/23 128/80    Maintain med doses F/up in 6months   Past Medical History:  Diagnosis Date   Chicken pox    Migraines    History reviewed. No pertinent surgical history. Social History   Socioeconomic History   Marital status: Widowed    Spouse name: Not on file   Number of children: 2   Years of education: Not on file   Highest  education level: Some college, no degree  Occupational History   Not on file  Tobacco Use   Smoking status: Never   Smokeless tobacco: Never  Vaping Use   Vaping status: Never Used  Substance and Sexual Activity   Alcohol use: Yes    Comment: social use   Drug use: No   Sexual activity: Yes    Birth control/protection: I.U.D.  Other Topics Concern   Not on file  Social History Narrative   Not on file   Social Drivers of Health   Financial Resource Strain: Low Risk  (10/21/2023)   Overall Financial Resource Strain (CARDIA)    Difficulty of Paying Living Expenses: Not very hard  Food Insecurity: Food Insecurity Present (10/21/2023)   Hunger Vital Sign    Worried About Running Out of Food in the Last Year: Sometimes true    Ran Out of Food in the Last Year: Never true  Transportation Needs: No Transportation Needs (10/21/2023)   PRAPARE - Administrator, Civil Service (Medical): No    Lack of Transportation (Non-Medical): No  Physical Activity: Inactive (10/21/2023)   Exercise Vital Sign    Days of Exercise per Week: 0 days    Minutes of Exercise per Session:  Not on file  Stress: Stress Concern Present (10/21/2023)   [employer] of Occupational Health - Occupational Stress Questionnaire    Feeling of Stress: To some extent  Social Connections: Moderately Integrated (10/21/2023)   Social Connection and Isolation Panel    Frequency of Communication with Friends and Family: More than three times a week    Frequency of Social Gatherings with Friends and Family: Once a week    Attends Religious Services: More than 4 times per year    Active Member of [employer] or Organizations: Yes    Attends Banker Meetings: More than 4 times per year    Marital Status: Widowed  Catering Manager Violence: Not on file   Family Status  Relation Name Status   Mother  Deceased   Father  Deceased   MGM  (Not Specified)   Brother half Alive  No partnership data on file    Family History  Problem Relation Age of Onset   Heart disease Mother 23       MI and CAD and death   Hypertension Mother    Alcohol abuse Father    Cancer Father 25       leukemia   Cancer Maternal Grandmother 50       ovarian   Diabetes Brother    Allergies  Allergen Reactions   Onion     Patient Care Team: Tomi Grandpre, [name], NP as PCP - General (Internal Medicine)   Medications: Outpatient Medications Prior to Visit  Medication Sig   amLODipine  (NORVASC ) 5 MG tablet Take 1 tablet (5 mg total) by mouth every evening.   chlorthalidone  (HYGROTON ) 25 MG tablet Take 1 tablet (25 mg total) by mouth daily.   fluticasone  (FLONASE ) 50 MCG/ACT nasal spray Place 1 spray into both nostrils daily for 3 days. (Patient taking differently: Place 1 spray into both nostrils as needed for allergies or rhinitis.)   potassium chloride  SA (KLOR-CON  M) 20 MEQ tablet Take 1 tablet (20 mEq total) by mouth daily.   [DISCONTINUED] EPINEPHrine  0.3 mg/0.3 mL IJ SOAJ injection Inject 0.3 mg into the muscle as needed for anaphylaxis.   No facility-administered medications prior to visit.    Review of Systems  Constitutional:  Negative for activity change, appetite change and unexpected weight change.  Respiratory: Negative.    Cardiovascular: Negative.   Gastrointestinal: Negative.   Endocrine: Negative for cold intolerance and heat intolerance.  Genitourinary: Negative.   Musculoskeletal: Negative.   Skin: Negative.   Neurological: Negative.   Hematological: Negative.   Psychiatric/Behavioral:  Negative for behavioral problems, decreased concentration, dysphoric mood, hallucinations, self-injury, sleep disturbance and suicidal ideas. The patient is not nervous/anxious.    Last lipids Lab Results  Component Value Date   CHOL 219 (H) 10/22/2023   HDL 44.20 10/22/2023   LDLCALC 161 (H) 10/22/2023   TRIG 72.0 10/22/2023   CHOLHDL 5 10/22/2023      Objective:  BP 132/74 (BP Location: Left  Arm, Patient Position: Sitting, Cuff Size: Large)   Temp 97.8 F (36.6 C) (Temporal)   Ht 5' 5 (1.651 m)   Wt 182 lb 12.8 oz (82.9 kg)   BMI 30.42 kg/m     Physical Exam Vitals and nursing note reviewed.  Constitutional:      General: She is not in acute distress. HENT:     Right Ear: Tympanic membrane, ear canal and external ear normal.     Left Ear: Tympanic membrane, ear canal and external ear normal.  Nose: Nose normal.  Eyes:     Extraocular Movements: Extraocular movements intact.     Conjunctiva/sclera: Conjunctivae normal.     Pupils: Pupils are equal, round, and reactive to light.  Neck:     Thyroid : No thyroid  mass, thyromegaly or thyroid  tenderness.  Cardiovascular:     Rate and Rhythm: Normal rate and regular rhythm.     Pulses: Normal pulses.     Heart sounds: Normal heart sounds.  Pulmonary:     Effort: Pulmonary effort is normal.     Breath sounds: Normal breath sounds.  Abdominal:     General: Bowel sounds are normal.     Palpations: Abdomen is soft.  Musculoskeletal:        General: Normal range of motion.     Cervical back: Normal range of motion and neck supple.     Right lower leg: No edema.     Left lower leg: No edema.  Lymphadenopathy:     Cervical: No cervical adenopathy.  Skin:    General: Skin is warm and dry.  Neurological:     Mental Status: She is alert and oriented to person, place, and time.     Cranial Nerves: No cranial nerve deficit.  Psychiatric:        Mood and Affect: Mood normal.        Behavior: Behavior normal.        Thought Content: Thought content normal.     No results found for any visits on 03/04/24.    Assessment & Plan:    Routine Health Maintenance and Physical Exam  Immunization History  Administered Date(s) Administered   Influenza,inj,Quad PF,6+ Mos 05/30/2016, 02/26/2018   PFIZER(Purple Top)SARS-COV-2 Vaccination 08/05/2019, 08/30/2019   Tdap 05/30/2016    Health Maintenance  Topic Date Due    Mammogram  Never done   Colonoscopy  Never done   Cervical Cancer Screening (HPV/Pap Cotest)  04/25/2024   COVID-19 Vaccine (3 - 2025-26 season) 03/20/2024 (Originally 12/29/2023)   Influenza Vaccine  07/27/2024 (Originally 11/28/2023)   Hepatitis B Vaccines 19-59 Average Risk (1 of 3 - 19+ 3-dose series) 03/04/2025 (Originally 01/05/1994)   Hepatitis C Screening  03/04/2025 (Originally 01/05/1993)   HIV Screening  03/04/2025 (Originally 01/05/1990)   DTaP/Tdap/Td (2 - Td or Tdap) 05/30/2026   Pneumococcal Vaccine  Aged Out   HPV VACCINES  Aged Out   Meningococcal B Vaccine  Aged Out   Discussed health benefits of physical activity, and encouraged her to engage in regular exercise appropriate for her age and condition. Advised to Complete Cologuard kit as soon as possible.  Problem List Items Addressed This Visit     Food allergy   She requested to switch from  IM epi to nasal spray. Neffy  Prescription sent      Relevant Medications   EPINEPHrine  (NEFFY ) 2 MG/0.1ML SOLN   Primary hypertension   BP at goal with amlodipine  and chlorthialidone BP Readings from Last 3 Encounters:  03/04/24 132/74  10/22/23 132/80  07/16/23 128/80    Maintain med doses F/up in 6months      Relevant Medications   EPINEPHrine  (NEFFY ) 2 MG/0.1ML SOLN   Pure hypercholesterolemia   Relevant Medications   EPINEPHrine  (NEFFY ) 2 MG/0.1ML SOLN   Other Visit Diagnoses       Encounter for preventative adult health care exam with abnormal findings    -  Primary   Relevant Orders   CBC   Comprehensive metabolic panel with GFR  Impaired fasting glucose       Relevant Orders   Hemoglobin A1c      Return in about 6 months (around 09/01/2024) for HTN, hyperlipidemia (fasting).     Roselie Mood, NP

## 2024-03-05 NOTE — Telephone Encounter (Signed)
 Medication change request sent by pts pharmacy. Prescription for Nefey Nasal Spray is not covered on pts formulary. Please change medication if appropriate.

## 2024-03-05 NOTE — Telephone Encounter (Signed)
 Patient called and informed that her insurance does not cover the Neffy  nasal spray that she will have to go back to using Epipen  as previously prescribed. Patient verbalized understanding and all (if any) questions were answered.

## 2024-03-09 ENCOUNTER — Ambulatory Visit: Payer: Self-pay | Admitting: Nurse Practitioner

## 2024-04-03 ENCOUNTER — Other Ambulatory Visit: Payer: Self-pay

## 2024-04-03 ENCOUNTER — Encounter (HOSPITAL_COMMUNITY): Payer: Self-pay

## 2024-04-03 ENCOUNTER — Ambulatory Visit (HOSPITAL_COMMUNITY): Admission: EM | Admit: 2024-04-03 | Discharge: 2024-04-03 | Disposition: A | Discharge status: Home / Self Care

## 2024-04-03 ENCOUNTER — Emergency Department (HOSPITAL_COMMUNITY)
Admission: EM | Admit: 2024-04-03 | Discharge: 2024-04-03 | Disposition: A | Discharge status: Home / Self Care | Attending: Emergency Medicine | Admitting: Emergency Medicine

## 2024-04-03 ENCOUNTER — Encounter (HOSPITAL_COMMUNITY): Payer: Self-pay | Admitting: *Deleted

## 2024-04-03 DIAGNOSIS — I10 Essential (primary) hypertension: Secondary | ICD-10-CM | POA: Insufficient documentation

## 2024-04-03 DIAGNOSIS — T782XXA Anaphylactic shock, unspecified, initial encounter: Secondary | ICD-10-CM | POA: Insufficient documentation

## 2024-04-03 DIAGNOSIS — R000 Tachycardia, unspecified: Secondary | ICD-10-CM | POA: Insufficient documentation

## 2024-04-03 DIAGNOSIS — Z79899 Other long term (current) drug therapy: Secondary | ICD-10-CM | POA: Insufficient documentation

## 2024-04-03 DIAGNOSIS — T7840XA Allergy, unspecified, initial encounter: Secondary | ICD-10-CM

## 2024-04-03 HISTORY — DX: Essential (primary) hypertension: I10

## 2024-04-03 MED ORDER — METHYLPREDNISOLONE SODIUM SUCC 125 MG IJ SOLR
125.0000 mg | Freq: Once | INTRAMUSCULAR | Status: AC
  Administered 2024-04-03: 125 mg via INTRAVENOUS
  Filled 2024-04-03: qty 2

## 2024-04-03 MED ORDER — DIPHENHYDRAMINE HCL 25 MG PO CAPS
50.0000 mg | ORAL_CAPSULE | Freq: Once | ORAL | Status: AC
  Administered 2024-04-03: 50 mg via ORAL
  Filled 2024-04-03: qty 2

## 2024-04-03 MED ORDER — FAMOTIDINE 20 MG PO TABS
40.0000 mg | ORAL_TABLET | Freq: Once | ORAL | Status: AC
  Administered 2024-04-03: 40 mg via ORAL

## 2024-04-03 MED ORDER — EPINEPHRINE 0.3 MG/0.3ML IJ SOAJ: 0.3000 mg | INTRAMUSCULAR | 0 refills | Status: AC | PRN

## 2024-04-03 MED ORDER — PREDNISONE 50 MG PO TABS: 50.0000 mg | ORAL_TABLET | Freq: Every day | ORAL | 0 refills | Status: AC

## 2024-04-03 MED ORDER — OMEPRAZOLE 20 MG PO CPDR: 20.0000 mg | DELAYED_RELEASE_CAPSULE | Freq: Every day | ORAL | 0 refills | Status: AC

## 2024-04-03 MED ORDER — FAMOTIDINE 40 MG/5ML PO SUSR: 40.0000 mg | Freq: Two times a day (BID) | ORAL | Status: DC

## 2024-04-03 NOTE — ED Provider Notes (Signed)
 [place] EMERGENCY DEPARTMENT AT [facility] Provider Note   CSN: [id number] Arrival date & time: 04/03/24  1941     Patient presents with: Allergic Reaction   Tina Swanson is a 48 y.o. female.   The history is provided by the patient, medical records and a relative. No language interpreter was used.  Allergic Reaction Presenting symptoms: no rash and no wheezing   Presenting symptoms comment:  Feeling throat closing Severity:  Moderate Duration:  2 hours Prior allergic episodes:  Food/nut allergies Context: food   Relieved by:  Nothing Worsened by:  Nothing Ineffective treatments:  Antihistamines      Prior to Admission medications   Medication Sig Start Date End Date Taking? Authorizing Provider  amLODipine  (NORVASC ) 5 MG tablet Take 1 tablet (5 mg total) by mouth every evening. 10/23/23   [name], [name], NP  chlorthalidone  (HYGROTON ) 25 MG tablet Take 1 tablet (25 mg total) by mouth daily. 10/22/23   [name], [name], NP  EPINEPHrine  0.3 mg/0.3 mL IJ SOAJ injection Inject 0.3 mg into the muscle as needed for anaphylaxis. 03/05/24   [name], Charlotte Lum, NP  fluticasone  (FLONASE ) 50 MCG/ACT nasal spray Place 1 spray into both nostrils daily for 3 days. Patient taking differently: Place 1 spray into both nostrils as needed for allergies or rhinitis. 01/10/22 03/04/24  Hazen Darryle BRAVO, FNP  potassium chloride  SA (KLOR-CON  M) 20 MEQ tablet Take 1 tablet (20 mEq total) by mouth daily. 11/07/23   [name], [name], NP    Allergies: Onion    Review of Systems  Constitutional:  Negative for chills, fatigue and fever.  HENT:  Negative for congestion.   Eyes:  Negative for visual disturbance.  Respiratory:  Negative for cough, chest tightness, shortness of breath and wheezing.   Cardiovascular:  Negative for chest pain.  Gastrointestinal:  Negative for abdominal pain, constipation, diarrhea, nausea and vomiting.  Genitourinary:  Negative for dysuria.   Musculoskeletal:  Negative for back pain, neck pain and neck stiffness.  Skin:  Negative for rash and wound.  Neurological:  Negative for dizziness, light-headedness and headaches.  Psychiatric/Behavioral:  Negative for agitation and confusion.   All other systems reviewed and are negative.   Updated Vital Signs BP (!) 178/98 (BP Location: Right Arm)   Pulse (!) 122   Temp 98.2 F (36.8 C)   Resp 19   Ht 5' 5 (1.651 m)   Wt 82.9 kg   LMP 03/27/2024   SpO2 100%   BMI 30.41 kg/m   Physical Exam Vitals and nursing note reviewed.  Constitutional:      General: She is not in acute distress.    Appearance: She is well-developed. She is not ill-appearing, toxic-appearing or diaphoretic.  HENT:     Head: Normocephalic and atraumatic.     Nose: No congestion or rhinorrhea.     Mouth/Throat:     Pharynx: No oropharyngeal exudate or posterior oropharyngeal erythema.  Eyes:     Extraocular Movements: Extraocular movements intact.     Conjunctiva/sclera: Conjunctivae normal.     Pupils: Pupils are equal, round, and reactive to light.  Cardiovascular:     Rate and Rhythm: Regular rhythm. Tachycardia present.     Heart sounds: No murmur heard. Pulmonary:     Effort: Pulmonary effort is normal. No respiratory distress.     Breath sounds: Normal breath sounds. No stridor. No wheezing, rhonchi or rales.  Chest:     Chest wall: No tenderness.  Abdominal:     General: Abdomen is flat.     Palpations: Abdomen is soft.     Tenderness: There is no abdominal tenderness. There is no left CVA tenderness, guarding or rebound.  Musculoskeletal:        General: No swelling or tenderness.     Cervical back: Neck supple.     Right lower leg: No edema.     Left lower leg: No edema.  Skin:    General: Skin is warm and dry.     Capillary Refill: Capillary refill takes less than 2 seconds.     Findings: No erythema or rash.  Neurological:     General: No focal deficit present.     Mental  Status: She is alert.  Psychiatric:        Mood and Affect: Mood normal.     (all labs ordered are listed, but only abnormal results are displayed) Labs Reviewed - No data to display  EKG: EKG Interpretation Date/Time:  Saturday April 03 2024 20:53:49 EST Ventricular Rate:  121 PR Interval:  157 QRS Duration:  77 QT Interval:  315 QTC Calculation: 447 R Axis:   59  Text Interpretation: Sinus tachycardia Ventricular premature complex Aberrant complex Consider right atrial enlargement Probable anteroseptal infarct, old Borderline ST depression, inferior leads when compared to prior, similar appearance no STEMI Confirmed by Ginger Barefoot ([id number]) on 04/03/2024 8:55:56 PM  Radiology: No results found.   Procedures   Medications Ordered in the ED  diphenhydrAMINE  (BENADRYL ) capsule 50 mg (50 mg Oral Given 04/03/24 1956)  famotidine  (PEPCID ) tablet 40 mg (40 mg Oral Given 04/03/24 2009)  methylPREDNISolone  sodium succinate (SOLU-MEDROL ) 125 mg/2 mL injection 125 mg (125 mg Intravenous Given 04/03/24 2122)                                    Medical Decision Making Risk Prescription drug management.    Tina Swanson is a 48 y.o. female with a past medical history significant for migraines, hypertension, and known onion allergy who presents with concern for allergic reaction.  According patient, she ate at [employer] and shortly after eating started feeling her throat closing with a knot.  She had not had this type reaction before but was very concerned and used her EpiPen  at about 7 PM.  She also took some Benadryl  at home.  She tells me that she is not having other itching, palpitations, chest pain, shortness of breath, nausea, vomiting, or other complaint.  She is very concerned but otherwise has no difficulty swallowing or breathing at this time.  On my exam, no stridor.  Lungs clear.  Chest nontender.  Abdomen nontender.  No focal neurologic deficits.  Patient resting.   Heart rate was initially was tachycardic but has been improving since she has been seen here.  Patient says that her symptoms are much improved since the EpiPen  use.  We had a shared decision-making conversation on management.  Given her lack of other symptoms we will hold on extensive workup.  Patient agrees.  Will hold on chest x-ray or labs.  Patient will get other allergic reaction medications and will get steroids and she had the antihistamines otherwise ordered in triage.  Will continue to watch her.  Given the throat swelling and closing sensation will likely watch for 6 hours since EpiPen  use.  She reports the EpiPen  was used about 7 PM.  That would put her at possible discharge at 1 AM if she does well.  Patient resting.        10:18 PM Nursing told me that patient was having some burning in her chest and wanted me to reassess the patient.  Patient was reassessed as she was having chest burning however she is now denying any chest pain.  Denies any shortness of breath or wheezing.  She is feeling well.  Heart rate is in the 80s.  Her EKG did not show STEMI it only showed the sinus tachycardia.  Patient was offered a GI cocktail or more extensive lab workup with chest x-ray or labs and she does not want them.  Anticipate discharge with further treatment of anaphylaxis if he remains stable.    11:28 PM Patient is feeling better and better and she would like to go home.  Vital signs reassuring.  She is not tachycardic or tachypneic.  No stridor and feels that her throat is improving.  She would like to go home.  Will discharge with prescription for steroids and prescription for Prilosec as well as she think she may have some heartburn.  She will get EpiPen  prescription and follow-up with her PCP.  She had other questions or concerns and we agreed to hold on further workup.  She no other questions or concerns and was discharged in good condition.       Final diagnoses:  Allergic  reaction, initial encounter  Anaphylaxis, initial encounter    ED Discharge Orders          Ordered    predniSONE  (DELTASONE ) 50 MG tablet  Daily        04/03/24 2327    EPINEPHrine  0.3 mg/0.3 mL IJ SOAJ injection  As needed        04/03/24 2327    omeprazole  (PRILOSEC) 20 MG capsule  Daily        04/03/24 2327            Clinical Impression: 1. Allergic reaction, initial encounter   2. Anaphylaxis, initial encounter     Disposition: Discharge  Condition: Good  I have discussed the results, Dx and Tx plan with the pt(& family if present). He/she/they expressed understanding and agree(s) with the plan. Discharge instructions discussed at great length. Strict return precautions discussed and pt &/or family have verbalized understanding of the instructions. No further questions at time of discharge.    New Prescriptions   EPINEPHRINE  0.3 MG/0.3 ML IJ SOAJ INJECTION    Inject 0.3 mg into the muscle as needed for anaphylaxis.   OMEPRAZOLE  (PRILOSEC) 20 MG CAPSULE    Take 1 capsule (20 mg total) by mouth daily.   PREDNISONE  (DELTASONE ) 50 MG TABLET    Take 1 tablet (50 mg total) by mouth daily for 5 days.    Follow Up: Katheen [name], NP [address] [place] [address] [phone]     [facility] Emergency Department at [facility] [address] [place]  [address] [phone]         Braxen Dobek, Lonni PARAS, MD 04/03/24 [phone]

## 2024-04-03 NOTE — ED Triage Notes (Signed)
 Pt present with possible allergic reaction. Pt states she was eating 2 hours ago and might have eaten something with onion in it. Pt as allergy to onion. Pt has administered EPI pen at home about 15 minutes ago. Pt states her throat feels funny and is having trouble swallowing.

## 2024-04-03 NOTE — ED Notes (Signed)
 The pt reports that  she still feels like a lump is down in her throat  no rash no itching

## 2024-04-03 NOTE — ED Provider Notes (Signed)
 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 No certain exposure to onions today. Felt throat was closing tonight, used her epipen  for the first time. Feels heart is racing. No SOB or difficulty breathing. Says she feels her throat is still not right but no longer feels like it is closing.   I explained to pt she will need monitoring for a period of time in case epi wears off but reaction is ongoing that we cannot provide in urgent care. Will need to go to ER. Offered EMS, pt feels she is ok to take herself to ER. She is speaking in complete sentences and no labored breathing.   BP (!) 174/128   Pulse (!) 111   Temp 97.7 F (36.5 C) (Oral)   Resp 17   SpO2 100%    BP and HR elevated as not unexpected with use of epi.   Jon Belt, PhD, FNP-BC [facility] Phone: [phone] 04/03/2024 7:39 PM    Belt Jon HERO, NP 04/03/24 1939

## 2024-04-03 NOTE — Discharge Instructions (Signed)
 Your history, exam, and evaluation today seem consistent with allergic reaction from something that you likely ate.  After the EpiPen  use, we watched you for several hours and you had consistent improvement in symptoms.  Your vital signs are reassuring and you do not have any other wheezing or stridor on exam.  Please fill the prescription for the prednisone  to take for the next 5 days starting tomorrow and fill the EpiPen  prescription as well.  Given the possible heartburn and irritation to your esophagus, we recommend starting the Prilosec and follow-up with your doctor.  If any symptoms change or worsen acutely, please return to the nearest emergency department.

## 2024-04-03 NOTE — ED Triage Notes (Signed)
 Provider at bed side to eval PT. Pt sent to ED for treatment

## 2024-04-03 NOTE — ED Triage Notes (Signed)
 PT states throat tightness as if she has a lump in her throat shortly after eating at 1830.  Used her epi pen and 25 mg of benadryl  at 1900 with improvement, but now the symptoms are increasing again.  Presently pt able to speak in full sentences.  Airway patent.  PT states the tightness is increasing.

## 2024-04-06 ENCOUNTER — Ambulatory Visit: Payer: Self-pay

## 2024-04-06 NOTE — Telephone Encounter (Addendum)
 FYI Only or Action Required?: Action required by provider: request for appointment and clinical question for provider. Can pt be seen in office today for ED f/u and ongoing mild allergic reaction for past 48 hours  Patient was last seen in primary care on 03/04/2024 by [name], [name], NP.  Called Nurse Triage reporting Allergic Reaction.  Symptoms began several days ago.  Interventions attempted: Prescription medications: Benadryl , pepcid , prednisone , epi pen (on Saturday before ED).  Symptoms are: gradually worsening.  Triage Disposition: See HCP Within 4 Hours (Or PCP Triage) (overriding See PCP When Office is Open (Within 3 Days))  Patient/caregiver understands and will follow disposition?: Yes   Message from Tanazia G sent at 04/06/2024 12:00 PM EST  Summary: sore throat   Reason for Triage: Patient is requesting a call back because she wants to know what else she can do because her throat is irritated. She had allergic reaction over the weekend and went to the ER. Was given medication but states she is still feeling weird. Patient is scheduled for this Thursday. Can be reached at [phone]. She said it feels like she has a sore throat and when she swallows like something is lingering its hard for her to explain.         Reason for Disposition  [1] Took antihistamine (e.g., Benadryl ) by mouth AND [2] no symptoms now    Mild symptoms now after taking benadryl , pepcid  and prednisone   Answer Assessment - Initial Assessment Questions Pt with onset of lump in throat with throat tingling and severely swollen tongue on Saturday night 12/6 after eating onion. Used epi pen and benadryl . Went to UC, advised to go to ED for ongoing observation. Went to ED, given  steroid and pepcid . Discharged home in stable condition after monitoring  Pt calls today report onset of mild throat tingling and mild soreness at end of day on Sunday after drinking tea with lemon 3x that day. Woke up Monday  morning with no symptoms. Drank tea with lemon again Monday morning with immediate onset mildly sore and mildly tight throat. No tongue swelling or SOB. This morning woke up with mildly sore throat. Mild compared to sore throat she had in the ED. Onset very mild SOB today, denies wheezing. Mildly swollen tongue. Hasn't had any lemon today. Took pepcid  and prednisone  this morning, and benadryl  15 minutes ago. Has not taken epi-pen. Pt scheduled for ED f/u on Thursday. Called [facility] and spoke with Jon to see if pt can be worked in today given mild ongoing symptoms over the past 2 days. They will call pt today to let her know if she can be seen. Pt hesitant to return to ED given mild ongoing symptoms. Advised to wait 30 minutes for benadryl  to take effect, and if no improvement to take epi-pen. Advised if symptoms worse to go to ED. Pt verbalized understanding.   1. SYMPTOMS: What is the most serious symptom?     Mild tongue swelling, significantly better compared to how tongue felt when going to ED on 12/6  2. AIRWAY: Are they breathing? (e.g., Yes, No)  (R/O: airway blockage, respiratory arrest)      Yes, mild SOB no wheezing  3. BREATHING: Is there difficulty breathing? (e.g., Yes, No; wheezing, unable to complete a sentence)  (R/O: respiratory distress)     Mild SOB, no wheezing. No gasping/wheezing or acute distress noted over the phone.  4. CIRCULATION: Are you feeling weak?  (e.g., Yes, No, Unknown; severity)  (R/O:  shock) If Yes, ask: Can you stand and walk normally?     Denies  5. SWALLOWING: Can you swallow? (e.g., Yes, No; food, fluid, saliva)      Yes. Mild difficulty swallowing  6. ONSET: When did the reaction start? (e.g., minutes, hours ago)      Sunday morning after drinking some tea with fresh lemon  7. SUBSTANCE: What are you reacting to? When did the contact occur?      Thinks she may be developing new reaction to lemon juice  8. PREVIOUS REACTION: Have you  ever reacted to it before? If Yes, ask: What happened that time?     Denies previous allergy to lemon juice, concerned for new allergy  9. EPINEPHRINE : Do you have an epinephrine  autoinjector (e.g., EpiPen )?     Yes, hasn't used since ED visit on Saturday  Protocols used: Anaphylaxis-A-AH

## 2024-04-07 ENCOUNTER — Ambulatory Visit: Payer: Self-pay

## 2024-04-07 ENCOUNTER — Encounter: Payer: Self-pay | Admitting: Sports Medicine

## 2024-04-07 ENCOUNTER — Ambulatory Visit: Admitting: Sports Medicine

## 2024-04-07 VITALS — BP 154/94 | HR 67 | Temp 99.0°F | Wt 180.1 lb

## 2024-04-07 DIAGNOSIS — I10 Essential (primary) hypertension: Secondary | ICD-10-CM | POA: Diagnosis not present

## 2024-04-07 DIAGNOSIS — R42 Dizziness and giddiness: Secondary | ICD-10-CM

## 2024-04-07 DIAGNOSIS — T7840XD Allergy, unspecified, subsequent encounter: Secondary | ICD-10-CM | POA: Diagnosis not present

## 2024-04-07 DIAGNOSIS — K219 Gastro-esophageal reflux disease without esophagitis: Secondary | ICD-10-CM | POA: Diagnosis not present

## 2024-04-07 MED ORDER — PREDNISONE 20 MG PO TABS: 20.0000 mg | ORAL_TABLET | Freq: Every day | ORAL | 0 refills | Status: DC

## 2024-04-07 MED ORDER — FAMOTIDINE 40 MG PO TABS: 40.0000 mg | ORAL_TABLET | Freq: Every day | ORAL | 0 refills | Status: DC

## 2024-04-07 NOTE — Telephone Encounter (Signed)
 FYI Only or Action Required?: FYI only for provider: appointment scheduled on 04/07/2024.  Patient was last seen in primary care on 03/04/2024 by [name], [name], NP.  Called Nurse Triage reporting Oral Swelling.  Symptoms began Saturday and ongoing.  Interventions attempted: Other: went to ER but continues to have issues.  Symptoms are: unchanged.  Triage Disposition: See HCP Within 4 Hours (Or PCP Triage)  Patient/caregiver understands and will follow disposition?: Yes    Copied from CRM #[id number]. Topic: Clinical - Red Word Triage >> Apr 07, 2024  9:01 AM Victoria A wrote: [facility] that prompted transfer to Nurse Triage: Patient is having swelling of the throat due to allergic reaction- Reason for Disposition  Fever > 100.4 F (38.0 C)  Answer Assessment - Initial Assessment Questions 1. DESCRIPTION: Tell me more about this problem. Are you  having trouble swallowing liquids, solids, or both? Any trouble with swallowing saliva (spit)?     Swallowing issues with solids and spit - takes longer swallow 2. SEVERITY: How bad is the swallowing difficulty?  (Scale 1-10; or mild, moderate, severe)     Mild  3. ONSET: When did the swallowing problems begin?      Monday had tingling and some tightness - had benadryl  4. CAUSE: What do you think is causing the problem?  (e.g., dry mouth, food or pill stuck in throat, mouth pain, sore throat, progression of disease process such as dementia or Parkinson's disease).      N/a 5. CHRONIC or RECURRENT: Is this a new problem for you?  If No, ask: How long have you had this problem? (e.g., days, weeks, months)      N/a 6. OTHER SYMPTOMS: Do you have any other symptoms? (e.g., chest pain, difficulty breathing, mouth sores, sore throat, swollen tongue, chest pain)     SOB, tongue swollen, left chest pain 1/10, have metallic taste in mouth, lips feel fat 7. PREGNANCY: Is there any chance you are pregnant? When was your last  menstrual period?     na  Saturday night - allergic reaction started to lemon  went to ER:  pt is still c/o having swallowing issues r/t allergic reaction.  Pt has been taking ben daryl to help relief symptoms without full relief.  Protocols used: Swallowing Difficulty-A-AH

## 2024-04-07 NOTE — Telephone Encounter (Signed)
 Patient scheduled to be seen at [facility] office

## 2024-04-07 NOTE — Assessment & Plan Note (Signed)
 Did not take her morning meds Monitor bp daily and keep a log Avoid salty foods Exercise regularly

## 2024-04-07 NOTE — Progress Notes (Signed)
 Careteam: Patient Care Team: [name], [name], NP as PCP - General (Internal Medicine)  Allergies  Allergen Reactions   Onion     Chief Complaint  Patient presents with   Allergic Reaction    Pt had an allergic reaction on Saturday where it felt like her throat was closing. She was seen at the ED and is still having issues.    Discussed the use of AI scribe software for clinical note transcription with the patient, who gave verbal consent to proceed.  History of Present Illness    Tina Swanson is a 48 year old female who presents with persistent throat soreness and difficulty swallowing following an emergency room visit.  She describes a constant sensation of tightness and soreness in her throat, akin to 'pressure' as if 'somebody's pushing on my throat'. This sensation is not limited to eating or drinking. Her tongue feels 'a little fat' and metallic, with tingling in her lips. States it's not all the time and feels occasionally. She has a history of allergic reactions, previously with onions, causing tingling and a scratchy throat.  She was treated in the emergency room with Solu-Medrol  and prednisone , of which she has one dose remaining from a five-day course. She is also taking Prilosec as instructed. She did not take her blood pressure medication today and does not regularly monitor her blood pressure at home.  No current breathing difficulties, nausea, vomiting, urination issues, or visual disturbances. She feels lightheaded, which she attributes to not eating well, and experiences dizziness both when sitting and moving around. No heartburn or acid reflux but mentions feeling gassy for months, which worsens when she doesn't eat.  No rash, itching, or wheals, and has not experienced any headaches, palpitations, or chest pain radiating to her left arm or neck since the emergency room visit.    No fever, chills, runny nose, cough, congestion  Review of Systems:   Review of Systems  Constitutional:  Negative for chills and fever.  HENT:  Positive for sore throat. Negative for congestion.        Throat pain and difficulty swallowing  Eyes:  Negative for blurred vision.  Respiratory:  Negative for cough, sputum production and shortness of breath.   Cardiovascular:  Negative for chest pain, palpitations and leg swelling.  Gastrointestinal:  Positive for heartburn. Negative for abdominal pain and nausea.  Genitourinary:  Negative for frequency and hematuria.  Musculoskeletal:  Negative for falls and myalgias.  Neurological:  Positive for dizziness. Negative for sensory change and focal weakness.   Negative unless indicated in HPI.   Patient Active Problem List   Diagnosis Date Noted   Nummular eczema 03/04/2024   Slow transit constipation 06/16/2023   Primary hypertension 05/26/2023   Pure hypercholesterolemia 03/14/2021   Tension headache 03/13/2021   Food allergy 03/13/2021   Premenstrual dysphoric disorder 12/21/2019   Genital herpes simplex 12/21/2019   Pruritus of vulva 12/21/2019   Uterine leiomyoma 04/28/2019   Past Medical History:  Diagnosis Date   Chicken pox    Hypertension    Migraines    History reviewed. No pertinent surgical history. Social History   Tobacco Use   Smoking status: Never   Smokeless tobacco: Never  Vaping Use   Vaping status: Never Used  Substance Use Topics   Alcohol use: Yes    Comment: social use   Drug use: No   Family History  Problem Relation Age of Onset   Heart disease Mother 72  MI and CAD and death   Hypertension Mother    Alcohol abuse Father    Cancer Father 20       leukemia   Cancer Maternal Grandmother 64       ovarian   Diabetes Brother    Allergies  Allergen Reactions   Onion     Medications: Patient's Medications  New Prescriptions   FAMOTIDINE  (PEPCID ) 40 MG TABLET    Take 1 tablet (40 mg total) by mouth daily.   PREDNISONE  (DELTASONE ) 20 MG TABLET    Take 1  tablet (20 mg total) by mouth daily with breakfast.  Previous Medications   AMLODIPINE  (NORVASC ) 5 MG TABLET    Take 1 tablet (5 mg total) by mouth every evening.   CHLORTHALIDONE  (HYGROTON ) 25 MG TABLET    Take 1 tablet (25 mg total) by mouth daily.   EPINEPHRINE  0.3 MG/0.3 ML IJ SOAJ INJECTION    Inject 0.3 mg into the muscle as needed for anaphylaxis.   EPINEPHRINE  0.3 MG/0.3 ML IJ SOAJ INJECTION    Inject 0.3 mg into the muscle as needed for anaphylaxis.   FLUTICASONE  (FLONASE ) 50 MCG/ACT NASAL SPRAY    Place 1 spray into both nostrils daily for 3 days.   OMEPRAZOLE  (PRILOSEC) 20 MG CAPSULE    Take 1 capsule (20 mg total) by mouth daily.   POTASSIUM CHLORIDE  SA (KLOR-CON  M) 20 MEQ TABLET    Take 1 tablet (20 mEq total) by mouth daily.   PREDNISONE  (DELTASONE ) 50 MG TABLET    Take 1 tablet (50 mg total) by mouth daily for 5 days.  Modified Medications   No medications on file  Discontinued Medications   No medications on file    Physical Exam: Vitals:   04/07/24 1027 04/07/24 1056  BP: (!) 154/94   Pulse: 67   Temp: 99 F (37.2 C)   TempSrc: Oral   SpO2: 99% 97%  Weight: 180 lb 1.9 oz (81.7 kg)    Body mass index is 29.97 kg/m. BP Readings from Last 3 Encounters:  04/07/24 (!) 154/94  04/03/24 (!) 124/90  04/03/24 (!) 174/128   Wt Readings from Last 3 Encounters:  04/07/24 180 lb 1.9 oz (81.7 kg)  04/03/24 182 lb 12.2 oz (82.9 kg)  03/04/24 182 lb 12.8 oz (82.9 kg)    Physical Exam Constitutional:      Appearance: Normal appearance.  HENT:     Head: Normocephalic and atraumatic.     Mouth/Throat:     Comments: Uvula midline No swelling of soft palate, tongue Cardiovascular:     Rate and Rhythm: Normal rate and regular rhythm.  Pulmonary:     Effort: Pulmonary effort is normal. No respiratory distress.     Breath sounds: Normal breath sounds. No stridor. No wheezing.  Abdominal:     General: Bowel sounds are normal. There is no distension.     Tenderness:  There is no abdominal tenderness. There is no guarding or rebound.     Comments:    Musculoskeletal:        General: No swelling or tenderness.     Cervical back: No tenderness.  Lymphadenopathy:     Cervical: No cervical adenopathy.  Skin:    General: Skin is dry.  Neurological:     Mental Status: She is alert. Mental status is at baseline.     Sensory: No sensory deficit.     Motor: No weakness.     Comments: Gait normal Rombergs neg No  nystagmus EOMI Strength and sensations intact Heel to shin neg Finger nose neg     Labs reviewed: Basic Metabolic Panel: Recent Labs    05/26/23 1352 07/16/23 1359 10/22/23 1355 11/05/23 1555 03/04/24 1356  NA 137 137 139  --  139  K 3.5 3.2* 2.7* 3.1* 3.9  CL 103 97 99  --  105  CO2 28 32 33*  --  28  GLUCOSE 82 96 95  --  89  BUN 8 11 9   --  11  CREATININE 0.74 0.79 0.73  --  0.71  CALCIUM 9.2 9.5 9.1  --  8.6  PHOS  --  3.2  --   --   --   TSH 0.73  --   --   --   --    Liver Function Tests: Recent Labs    05/26/23 1352 07/16/23 1359 03/04/24 1356  AST 26  --  20  ALT 19  --  16  ALKPHOS 60  --  57  BILITOT 0.3  --  0.3  PROT 7.4  --  7.2  ALBUMIN 4.2 4.2 4.0   No results for input(s): LIPASE, AMYLASE in the last 8760 hours. No results for input(s): AMMONIA in the last 8760 hours. CBC: Recent Labs    05/26/23 1352 03/04/24 1356  WBC 3.9* 3.7*  NEUTROABS 1.9  --   HGB 13.0 12.3  HCT 40.6 38.0  MCV 85.7 83.9  PLT 293.0 296.0   Lipid Panel: Recent Labs    07/16/23 1359 10/22/23 1355  CHOL 199 219*  HDL 58.50 44.20  LDLCALC 131* 161*  TRIG 46.0 72.0  CHOLHDL 3 5   TSH: Recent Labs    05/26/23 1352  TSH 0.73   A1C: Lab Results  Component Value Date   HGBA1C 6.0 03/04/2024    Assessment & Plan Allergic reaction, subsequent encounter Pt c/o some discomfort in her throat  No swelling of lips, tongue , uvula No stridor Will give prednisone  20 mg x 5 days along with famotidine  Will  refer to allergy Orders:   Ambulatory referral to Allergy   predniSONE  (DELTASONE ) 20 MG tablet; Take 1 tablet (20 mg total) by mouth daily with breakfast.   famotidine  (PEPCID ) 40 MG tablet; Take 1 tablet (40 mg total) by mouth daily.  Gastroesophageal reflux disease, unspecified whether esophagitis present Avoid spicy foods Take famotidine      Dizziness Orthostasis  negative Increase oral hydration  Neuro exam unrearkable    Primary hypertension Did not take her morning meds Monitor bp daily and keep a log Avoid salty foods Exercise regularly

## 2024-04-08 ENCOUNTER — Inpatient Hospital Stay: Admitting: Nurse Practitioner

## 2024-04-12 NOTE — Progress Notes (Unsigned)
 New Patient Note  RE: Tina Swanson MRN: [id number] DOB: 1975-08-10 Date of Office Visit: 04/13/2024  Consult requested by: Tina Madden, MD Primary care provider: Katheen [name], NP  Chief Complaint: No chief complaint on file.  History of Present Illness: I had the pleasure of seeing Tina Swanson for [facility]  on [facility]/13/20[facility]. She is a 48 y.o. female, who is referred here by Tina Madden, MD for the evaluation of allergic reaction.  Discussed the use of AI scribe software for clinical note transcription with the patient, who gave verbal consent to proceed.  History of Present Illness             04/03/2024 ER visit: Tina Swanson is a 48 y.o. female with a past medical history significant for migraines, hypertension, and known onion allergy who presents with concern for allergic reaction.  According patient, she ate at [employer] and shortly after eating started feeling her throat closing with a knot.  She had not had this type reaction before but was very concerned and used her EpiPen  at about 7 PM.  She also took some Benadryl  at home.  She tells me that she is not having other itching, palpitations, chest pain, shortness of breath, nausea, vomiting, or other complaint.  She is very concerned but otherwise has no difficulty swallowing or breathing at this time.   On my exam, no stridor.  Lungs clear.  Chest nontender.  Abdomen nontender.  No focal neurologic deficits.  Patient resting.  Heart rate was initially was tachycardic but has been improving since she has been seen here.  Patient says that her symptoms are much improved since the EpiPen  use.   We had a shared decision-making conversation on management.  Given her lack of other symptoms we will hold on extensive workup.  Patient agrees.  Will hold on chest x-ray or labs.  Patient will get other allergic reaction medications and will get  steroids and she had the antihistamines otherwise ordered in triage.  Will continue to watch her.  Given the throat swelling and closing sensation will likely watch for 6 hours since EpiPen  use.  She reports the EpiPen  was used about 7 PM.  That would put her at possible discharge at 1 AM if she does well.   Patient resting.          10:18 PM Nursing told me that patient was having some burning in her chest and wanted me to reassess the patient.  Patient was reassessed as she was having chest burning however she is now denying any chest pain.  Denies any shortness of breath or wheezing.  She is feeling well.  Heart rate is in the 80s.  Her EKG did not show STEMI it only showed the sinus tachycardia.   Patient was offered a GI cocktail or more extensive lab workup with chest x-ray or labs and she does not want them.  Anticipate discharge with further treatment of anaphylaxis if he remains stable.     11:28 PM Patient is feeling better and better and she would like to go home.  Vital signs reassuring.  She is not tachycardic or tachypneic.  No stridor and feels that her throat is improving.  She would like to go home.   Will discharge with prescription for steroids and prescription for Prilosec as well as she think she may have some heartburn.  She will get EpiPen  prescription and follow-up with her  PCP.   She had other questions or concerns and we agreed to hold on further workup.  She no other questions or concerns and was discharged in good condition.  Assessment and Plan: Tina Swanson is a 48 y.o. female with: ***  Assessment and Plan               No follow-ups on file.  No orders of the defined types were placed in this encounter.  Lab Orders  No laboratory test(s) ordered today    Other allergy screening: Asthma: {Blank single:19197::yes,no} Rhino conjunctivitis: {Blank single:19197::yes,no} Food allergy: {Blank single:19197::yes,no} Medication allergy: {Blank  single:19197::yes,no} Hymenoptera allergy: {Blank single:19197::yes,no} Urticaria: {Blank single:19197::yes,no} Eczema:{Blank single:19197::yes,no} History of recurrent infections suggestive of immunodeficency: {Blank single:19197::yes,no}  Diagnostics: Spirometry:  Tracings reviewed. Her effort: {Blank single:19197::Good reproducible efforts.,It was hard to get consistent efforts and there is a question as to whether this reflects a maximal maneuver.,Poor effort, data can not be interpreted.} FVC: ***L FEV1: ***L, ***% predicted FEV1/FVC ratio: ***% Interpretation: {Blank single:19197::Spirometry consistent with mild obstructive disease,Spirometry consistent with moderate obstructive disease,Spirometry consistent with severe obstructive disease,Spirometry consistent with possible restrictive disease,Spirometry consistent with mixed obstructive and restrictive disease,Spirometry uninterpretable due to technique,Spirometry consistent with normal pattern,No overt abnormalities noted given today's efforts}.  Please see scanned spirometry results for details.  Skin Testing: {Blank single:19197::Select foods,Environmental allergy panel,Environmental allergy panel and select foods,Food allergy panel,None,Deferred due to recent antihistamines use}. *** Results discussed with patient/family.   Past Medical History: Patient Active Problem List   Diagnosis Date Noted   Nummular eczema 03/04/2024   Slow transit constipation 06/16/2023   Primary hypertension 05/26/2023   Pure hypercholesterolemia 03/14/2021   Tension headache 03/13/2021   Food allergy 03/13/2021   Premenstrual dysphoric disorder 12/21/2019   Genital herpes simplex 12/21/2019   Pruritus of vulva 12/21/2019   Uterine leiomyoma 04/28/2019   Past Medical History:  Diagnosis Date   Chicken pox    Hypertension    Migraines    Past Surgical History: No past surgical history  on file. Medication List:  Current Outpatient Medications  Medication Sig Dispense Refill   amLODipine  (NORVASC ) 5 MG tablet Take 1 tablet (5 mg total) by mouth every evening. 90 tablet 1   chlorthalidone  (HYGROTON ) 25 MG tablet Take 1 tablet (25 mg total) by mouth daily. 90 tablet 0   EPINEPHrine  0.3 mg/0.3 mL IJ SOAJ injection Inject 0.3 mg into the muscle as needed for anaphylaxis. 1 each 1   EPINEPHrine  0.3 mg/0.3 mL IJ SOAJ injection Inject 0.3 mg into the muscle as needed for anaphylaxis. 1 each 0   famotidine  (PEPCID ) 40 MG tablet Take 1 tablet (40 mg total) by mouth daily. 5 tablet 0   fluticasone  (FLONASE ) 50 MCG/ACT nasal spray Place 1 spray into both nostrils daily for 3 days. (Patient taking differently: Place 1 spray into both nostrils as needed for allergies or rhinitis.) 16 g 0   omeprazole  (PRILOSEC) 20 MG capsule Take 1 capsule (20 mg total) by mouth daily. 30 capsule 0   potassium chloride  SA (KLOR-CON  M) 20 MEQ tablet Take 1 tablet (20 mEq total) by mouth daily. 90 tablet 1   predniSONE  (DELTASONE ) 20 MG tablet Take 1 tablet (20 mg total) by mouth daily with breakfast. 5 tablet 0   No current facility-administered medications for this visit.   Allergies: Allergies[1] Social History: Social History   Socioeconomic History   Marital status: Widowed    Spouse name: Not on file   Number of children: 2  Years of education: Not on file   Highest education level: Some college, no degree  Occupational History   Not on file  Tobacco Use   Smoking status: Never   Smokeless tobacco: Never  Vaping Use   Vaping status: Never Used  Substance and Sexual Activity   Alcohol use: Yes    Comment: social use   Drug use: No   Sexual activity: Yes    Birth control/protection: I.U.D.  Other Topics Concern   Not on file  Social History Narrative   Not on file   Social Drivers of Health   Tobacco Use: Low Risk (04/07/2024)   Patient History    Smoking Tobacco Use: Never     Smokeless Tobacco Use: Never    Passive Exposure: Not on file  Financial Resource Strain: Low Risk (10/21/2023)   Overall Financial Resource Strain (CARDIA)    Difficulty of Paying Living Expenses: Not very hard  Food Insecurity: Food Insecurity Present (10/21/2023)   Epic    Worried About Programme Researcher, Broadcasting/film/video in the Last Year: Sometimes true    Ran Out of Food in the Last Year: Never true  Transportation Needs: No Transportation Needs (10/21/2023)   Epic    Lack of Transportation (Medical): No    Lack of Transportation (Non-Medical): No  Physical Activity: Inactive (10/21/2023)   Exercise Vital Sign    Days of Exercise per Week: 0 days    Minutes of Exercise per Session: Not on file  Stress: Stress Concern Present (10/21/2023)   [employer] of Occupational Health - Occupational Stress Questionnaire    Feeling of Stress: To some extent  Social Connections: Moderately Integrated (10/21/2023)   Social Connection and Isolation Panel    Frequency of Communication with Friends and Family: More than three times a week    Frequency of Social Gatherings with Friends and Family: Once a week    Attends Religious Services: More than 4 times per year    Active Member of [employer] or Organizations: Yes    Attends Banker Meetings: More than 4 times per year    Marital Status: Widowed  Depression (PHQ2-9): Medium Risk (03/04/2024)   Depression (PHQ2-9)    PHQ-2 Score: 5  Alcohol Screen: Low Risk (10/21/2023)   Alcohol Screen    Last Alcohol Screening Score (AUDIT): 2  Housing: High Risk (10/21/2023)   Epic    Unable to Pay for Housing in the Last Year: Yes    Number of Times Moved in the Last Year: Not on file    Homeless in the Last Year: No  Utilities: Not on file  Health Literacy: Not on file   Lives in a ***. Smoking: *** Occupation: ***  Environmental HistorySurveyor, Minerals in the house: Network Engineer in the family room: {Blank  single:19197::yes,no} Carpet in the bedroom: {Blank single:19197::yes,no} Heating: {Blank single:19197::electric,gas,heat pump} Cooling: {Blank single:19197::central,window,heat pump} Pet: {Blank single:19197::yes ***,no}  Family History: Family History  Problem Relation Age of Onset   Heart disease Mother 50       MI and CAD and death   Hypertension Mother    Alcohol abuse Father    Cancer Father 31       leukemia   Cancer Maternal Grandmother 50       ovarian   Diabetes Brother    Problem  Relation Asthma                                   *** Eczema                                *** Food allergy                          *** Allergic rhino conjunctivitis     ***  Review of Systems  Constitutional:  Negative for appetite change, chills, fever and unexpected weight change.  HENT:  Negative for congestion and rhinorrhea.   Eyes:  Negative for itching.  Respiratory:  Negative for cough, chest tightness, shortness of breath and wheezing.   Cardiovascular:  Negative for chest pain.  Gastrointestinal:  Negative for abdominal pain.  Genitourinary:  Negative for difficulty urinating.  Skin:  Negative for rash.  Neurological:  Negative for headaches.    Objective: LMP 03/27/2024  There is no height or weight on file to calculate BMI. Physical Exam Vitals and nursing note reviewed.  Constitutional:      Appearance: Normal appearance. She is well-developed.  HENT:     Head: Normocephalic and atraumatic.     Right Ear: Tympanic membrane and external ear normal.     Left Ear: Tympanic membrane and external ear normal.     Nose: Nose normal.     Mouth/Throat:     Mouth: Mucous membranes are moist.     Pharynx: Oropharynx is clear.  Eyes:     Conjunctiva/sclera: Conjunctivae normal.  Cardiovascular:     Rate and Rhythm: Normal rate and regular rhythm.     Heart sounds: Normal heart sounds. No murmur heard.    No friction  rub. No gallop.  Pulmonary:     Effort: Pulmonary effort is normal.     Breath sounds: Normal breath sounds. No wheezing, rhonchi or rales.  Musculoskeletal:     Cervical back: Neck supple.  Skin:    General: Skin is warm.     Findings: No rash.  Neurological:     Mental Status: She is alert and oriented to person, place, and time.  Psychiatric:        Behavior: Behavior normal.    The plan was reviewed with the patient/family, and all questions/concerned were addressed.  It was my pleasure to see Charyl today and participate in her care. Please feel free to contact me with any questions or concerns.  Sincerely,  Orlan Cramp, DO Allergy & Immunology  [facility]  [facility] office: [phone] [facility] office: [phone]    [1]  Allergies Allergen Reactions   Onion

## 2024-04-13 ENCOUNTER — Encounter: Payer: Self-pay | Admitting: Allergy

## 2024-04-13 ENCOUNTER — Ambulatory Visit: Payer: Self-pay | Admitting: Allergy

## 2024-04-13 ENCOUNTER — Other Ambulatory Visit: Payer: Self-pay

## 2024-04-13 VITALS — BP 110/80 | HR 84 | Temp 98.3°F | Resp 18 | Ht 65.5 in | Wt 178.4 lb

## 2024-04-13 DIAGNOSIS — T7800XA Anaphylactic reaction due to unspecified food, initial encounter: Secondary | ICD-10-CM | POA: Diagnosis not present

## 2024-04-13 DIAGNOSIS — R12 Heartburn: Secondary | ICD-10-CM | POA: Diagnosis not present

## 2024-04-13 MED ORDER — FAMOTIDINE 40 MG PO TABS: 40.0000 mg | ORAL_TABLET | Freq: Every day | ORAL | 3 refills | Status: AC

## 2024-04-13 NOTE — Patient Instructions (Addendum)
?   Allergic reactions Monitor symptoms. Continue to avoid lemons, onions and acidic foods. Avoid seafood until labs are back.  Start famotidine  40mg  once a day in the morning. Use over the counter antihistamines such as Zyrtec (cetirizine), Claritin (loratadine), Allegra (fexofenadine), or Xyzal (levocetirizine) daily at night.   For mild symptoms you can take over the counter antihistamines (zyrtec 10mg  to 20mg ) and monitor symptoms closely.  If symptoms worsen or if you have severe symptoms including breathing issues, throat closure, significant swelling, whole body hives, severe diarrhea and vomiting, lightheadedness then use epinephrine  and seek immediate medical care afterwards. Emergency action plan given.  Get bloodwork If negative will plan on skin testing next and/or ENT referral. If positive, will recheck in 1 year.  We are ordering labs, so please allow 1-2 weeks for the results to come back. With the newly implemented Cures Act, the labs might be visible to you at the same time that they become visible to me. However, I will not address the results until all of the results are back, so please be patient.  In the meantime, continue recommendations in your patient instructions, including avoidance measures (if applicable), until you hear from me.  Heartburn See handout for lifestyle and dietary modifications.  Follow up as needed depending on lab results.

## 2024-04-16 LAB — ALLERGEN PROFILE, FOOD-CITRUS
Allergen Grapefruit IgE: <0.1 kU/L
Allergen Lime IgE: <0.1 kU/L
Lemon: <0.1 kU/L
Orange: <0.1 kU/L
Tangerine IgE: <0.1 kU/L

## 2024-04-16 LAB — ALLERGY PROFILE, FOOD
Allergen Salmon IgE: <0.1 kU/L
Codfish IgE: <0.1 kU/L
F001-IgE Egg White: <0.1 kU/L
F002-IgE Milk: <0.1 kU/L
F004-IgE Wheat: <0.1 kU/L
F010-IgE Sesame Seed: <0.1 kU/L
F017-IgE Hazelnut (Filbert): <0.1 kU/L
F018-IgE Brazil Nut: <0.1 kU/L
F020-IgE Almond: <0.1 kU/L
F202-IgE Cashew Nut: <0.1 kU/L
F256-IgE Walnut: <0.1 kU/L
F416-IgE Tri a 19(w-5 gliadin): <0.1 kU/L
Peanut, IgE: <0.1 kU/L
Scallop IgE: <0.1 kU/L
Shrimp IgE: <0.1 kU/L
Soybean IgE: <0.1 kU/L
Tuna: <0.1 kU/L

## 2024-04-16 LAB — ALLERGEN, ONION, F48: Allergen Onion IgE: <0.1 kU/L

## 2024-04-16 LAB — TRYPTASE: Tryptase: 2.8 ug/L (ref 2.2–13.2)

## 2024-04-16 LAB — IGE: IgE (Immunoglobulin E), Serum: 32 [IU]/mL (ref 6–495)

## 2024-04-19 ENCOUNTER — Ambulatory Visit: Payer: Self-pay | Admitting: Allergy

## 2024-09-01 ENCOUNTER — Ambulatory Visit: Admitting: Nurse Practitioner
